# Patient Record
Sex: Female | Born: 1985 | Race: White | Hispanic: No | Marital: Married | Smoking: Never smoker
Health system: Southern US, Community
[De-identification: ages and names within clinical notes are randomized; demographics above are authoritative.]

## PROBLEM LIST (undated history)

## (undated) DIAGNOSIS — M940 Chondrocostal junction syndrome [Tietze]: Secondary | ICD-10-CM

## (undated) DIAGNOSIS — M1990 Unspecified osteoarthritis, unspecified site: Secondary | ICD-10-CM

## (undated) HISTORY — DX: Unspecified osteoarthritis, unspecified site: M19.90

## (undated) HISTORY — DX: Chondrocostal junction syndrome (tietze): M94.0

---

## 2009-04-19 HISTORY — PX: WISDOM TOOTH EXTRACTION: SHX21

## 2015-06-16 ENCOUNTER — Ambulatory Visit (INDEPENDENT_AMBULATORY_CARE_PROVIDER_SITE_OTHER): Payer: BLUE CROSS/BLUE SHIELD | Admitting: Nurse Practitioner

## 2015-06-16 ENCOUNTER — Encounter: Payer: Self-pay | Admitting: Nurse Practitioner

## 2015-06-16 VITALS — BP 98/56 | HR 77 | Temp 98.9°F | Resp 16 | Ht 62.0 in | Wt 118.4 lb

## 2015-06-16 DIAGNOSIS — L700 Acne vulgaris: Secondary | ICD-10-CM

## 2015-06-16 DIAGNOSIS — Z7689 Persons encountering health services in other specified circumstances: Secondary | ICD-10-CM

## 2015-06-16 DIAGNOSIS — Z7189 Other specified counseling: Secondary | ICD-10-CM | POA: Diagnosis not present

## 2015-06-16 DIAGNOSIS — M940 Chondrocostal junction syndrome [Tietze]: Secondary | ICD-10-CM

## 2015-06-16 DIAGNOSIS — L709 Acne, unspecified: Secondary | ICD-10-CM | POA: Insufficient documentation

## 2015-06-16 MED ORDER — TRETINOIN 0.025 % EX CREA: TOPICAL_CREAM | Freq: Every day | CUTANEOUS | Status: DC

## 2015-06-16 MED ORDER — NAPROXEN 500 MG PO TABS: 500.0000 mg | ORAL_TABLET | Freq: Two times a day (BID) | ORAL | Status: DC

## 2015-06-16 NOTE — Assessment & Plan Note (Signed)
New onset Forehead is biggest concern- papular and pt reports sometimes pustular.  Will try Retin-A at night  Cautioned about using sunscreen daily, esp. In the spring/summer FU in 1 month

## 2015-06-16 NOTE — Progress Notes (Signed)
Patient ID: Vickie Ruiz, female    DOB: 01-09-1986  Age: 30 y.o. MRN: [id number]  CC: Establish Care and Acne   HPI Vickie Ruiz presents for establishing care and CC of acne.   1) New Pt Info:   Immunizations- Tdap 2010   Pap- 07/02/14, back with [facility]   LMP 05/19/15   2) Chronic Problems-  Costochondritis- chronic, last flare in August, middle to left of chest   3) Acute Problems-  Acne- 3 weeks ago, not using anything, better after moisturizer. Clinique cleanswer, Aveeno moisturizer, Bare minerals (older and threw away). Not tried anything else in particular or seen dermatology.    History Vickie Ruiz has a past medical history of Arthritis.   She has past surgical history that includes wisdom (2011).   Her family history includes Cancer in her maternal grandfather and paternal grandfather; Hypertension in her mother; Stroke in her maternal grandmother.She reports that she has never smoked. She does not have any smokeless tobacco history on file. She reports that she drinks alcohol. She reports that she does not use illicit drugs.  No outpatient prescriptions prior to visit.   No facility-administered medications prior to visit.    ROS Review of Systems  Constitutional: Negative for fever, chills, diaphoresis and fatigue.  Respiratory: Negative for chest tightness, shortness of breath and wheezing.   Cardiovascular: Negative for chest pain, palpitations and leg swelling.  Gastrointestinal: Negative for nausea, vomiting and diarrhea.  Skin: Negative for rash.  Neurological: Negative for dizziness, weakness, numbness and headaches.  Psychiatric/Behavioral: The patient is not nervous/anxious.     Objective:  BP 98/56 mmHg  Pulse 77  Temp(Src) 98.9 F (37.2 C) (Oral)  Resp 16  Ht  (1.575 m)  Wt 118 lb 6.4 oz (53.706 kg)  BMI 21.65 kg/m2  SpO2 97%  LMP 05/19/2015  Physical Exam  Constitutional: She is oriented to person, place, and time. She appears  well-developed and well-nourished. No distress.  HENT:  Head: Normocephalic and atraumatic.    Right Ear: External ear normal.  Left Ear: External ear normal.  Papular rash of forehead (acne), not pustular today, scattered papules in the line where her glasses sit  Cardiovascular: Normal rate, regular rhythm and normal heart sounds.  Exam reveals no gallop and no friction rub.   No murmur heard. Pulmonary/Chest: Effort normal and breath sounds normal. No respiratory distress. She has no wheezes. She has no rales. She exhibits no tenderness.  Neurological: She is alert and oriented to person, place, and time. No cranial nerve deficit. She exhibits normal muscle tone. Coordination normal.  Skin: Skin is warm and dry. Rash noted. She is not diaphoretic.  Psychiatric: She has a normal mood and affect. Her behavior is normal. Judgment and thought content normal.   Assessment & Plan:   Vickie Ruiz was seen today for establish care and acne.  Diagnoses and all orders for this visit:  Encounter to establish care  Acne vulgaris  Costochondritis  Other orders -     tretinoin (RETIN-A) 0.025 % cream; Apply topically at bedtime. -     naproxen (NAPROSYN) 500 MG tablet; Take 1 tablet (500 mg total) by mouth 2 (two) times daily with a meal.   I have changed Vickie Ruiz's naproxen. I am also having her start on tretinoin. Additionally, I am having her maintain her levonorgestrel-ethinyl estradiol.  Meds ordered this encounter  Medications  . levonorgestrel-ethinyl estradiol (SEASONALE,INTROVALE,JOLESSA) 0.15-0.03 MG tablet    Sig: Take 1 tablet by mouth daily.  Refill:  2  . DISCONTD: naproxen (NAPROSYN) 500 MG tablet    Sig: Take 500 mg by mouth 2 (two) times daily with a meal.  . tretinoin (RETIN-A) 0.025 % cream    Sig: Apply topically at bedtime.    Dispense:  45 g    Refill:  0    Order Specific Question:  Supervising Provider    Answer:  Darrick Huntsman, TERESA L [[id number]]  . naproxen  (NAPROSYN) 500 MG tablet    Sig: Take 1 tablet (500 mg total) by mouth 2 (two) times daily with a meal.    Dispense:  60 tablet    Refill:  0    Order Specific Question:  Supervising Provider    Answer:  Sherlene Shams [[id number]]     Follow-up: Return in about 4 weeks (around 07/14/2015) for Acne follow up.

## 2015-06-16 NOTE — Patient Instructions (Signed)
Welcome to [facility]! Nice to meet you.  See you in 1 month for follow up on acne.

## 2015-06-16 NOTE — Assessment & Plan Note (Signed)
Discussed acute and chronic issues. Reviewed health maintenance measures, PFSHx, and immunizations. Obtain records from previous facility   [facility]  Dr. Loni Muse  [address], [place] [address]

## 2015-06-16 NOTE — Assessment & Plan Note (Signed)
New problem to me Flares every few years  Uses Naproxen and needs a refill Sent to pharmacy  FU prn worsening/failure to improve.

## 2015-07-10 LAB — HM PAP SMEAR: HM PAP: NEGATIVE

## 2015-07-17 ENCOUNTER — Ambulatory Visit (INDEPENDENT_AMBULATORY_CARE_PROVIDER_SITE_OTHER): Payer: BLUE CROSS/BLUE SHIELD | Admitting: Nurse Practitioner

## 2015-07-17 ENCOUNTER — Telehealth: Payer: Self-pay

## 2015-07-17 ENCOUNTER — Encounter: Payer: Self-pay | Admitting: Nurse Practitioner

## 2015-07-17 VITALS — BP 100/64 | HR 82 | Temp 97.8°F | Ht 62.0 in | Wt 114.1 lb

## 2015-07-17 DIAGNOSIS — Z23 Encounter for immunization: Secondary | ICD-10-CM | POA: Diagnosis not present

## 2015-07-17 DIAGNOSIS — Z0000 Encounter for general adult medical examination without abnormal findings: Secondary | ICD-10-CM

## 2015-07-17 LAB — CBC WITH DIFFERENTIAL/PLATELET
Basophils Absolute: 0 10*3/uL (ref 0.0–0.1)
Basophils Relative: 0.8 % (ref 0.0–3.0)
EOS ABS: 0.1 10*3/uL (ref 0.0–0.7)
Eosinophils Relative: 1.3 % (ref 0.0–5.0)
HEMATOCRIT: 41.9 % (ref 36.0–46.0)
Hemoglobin: 14.4 g/dL (ref 12.0–15.0)
LYMPHS PCT: 40.9 % (ref 12.0–46.0)
Lymphs Abs: 1.9 10*3/uL (ref 0.7–4.0)
MCHC: 34.2 g/dL (ref 30.0–36.0)
MCV: 91.6 fl (ref 78.0–100.0)
MONO ABS: 0.3 10*3/uL (ref 0.1–1.0)
Monocytes Relative: 7 % (ref 3.0–12.0)
Neutro Abs: 2.3 10*3/uL (ref 1.4–7.7)
Neutrophils Relative %: 50 % (ref 43.0–77.0)
PLATELETS: 200 10*3/uL (ref 150.0–400.0)
RBC: 4.58 Mil/uL (ref 3.87–5.11)
RDW: 13 % (ref 11.5–15.5)
WBC: 4.7 10*3/uL (ref 4.0–10.5)

## 2015-07-17 LAB — HEPATIC FUNCTION PANEL
ALK PHOS: 49 U/L (ref 39–117)
ALT: 26 U/L (ref 0–35)
AST: 23 U/L (ref 0–37)
Albumin: 4.1 g/dL (ref 3.5–5.2)
BILIRUBIN DIRECT: 0.1 mg/dL (ref 0.0–0.3)
TOTAL PROTEIN: 7.2 g/dL (ref 6.0–8.3)
Total Bilirubin: 0.5 mg/dL (ref 0.2–1.2)

## 2015-07-17 LAB — BASIC METABOLIC PANEL
BUN: 8 mg/dL (ref 6–23)
CHLORIDE: 103 meq/L (ref 96–112)
CO2: 25 mEq/L (ref 19–32)
CREATININE: 0.85 mg/dL (ref 0.40–1.20)
Calcium: 8.9 mg/dL (ref 8.4–10.5)
GFR: 84.34 mL/min (ref >60.00)
Glucose, Bld: 77 mg/dL (ref 70–99)
Potassium: 4.2 mEq/L (ref 3.5–5.1)
Sodium: 136 mEq/L (ref 135–145)

## 2015-07-17 LAB — LIPID PANEL
CHOL/HDL RATIO: 3
CHOLESTEROL: 134 mg/dL (ref 0–200)
HDL: 47.6 mg/dL (ref >39.00)
LDL Cholesterol: 76 mg/dL (ref 0–99)
NonHDL: 86.79
TRIGLYCERIDES: 55 mg/dL (ref 0.0–149.0)
VLDL: 11 mg/dL (ref 0.0–40.0)

## 2015-07-17 NOTE — Progress Notes (Signed)
Pre visit review using our clinic review tool, if applicable. No additional management support is needed unless otherwise documented below in the visit note. 

## 2015-07-17 NOTE — Progress Notes (Signed)
Patient ID: Vickie Ruiz, female    DOB: 01/31/1986  Age: 30 y.o. MRN: [id number]  CC: Annual Exam   HPI Vickie Ruiz presents for Vickie Ruiz Annual Physical Exam.  1) Annual Physical   Diet- eating healthier diet and slowly adding in small amounts of meat after many years of vegetarianism  Exercise- exercising multiple times a week  Immunizations- TDap given today  Pap- patient has OB/GYN  Eye Exam- up-to-date  Dental Exam- up-to-date  LMP- patient is on a regular cycle, unsure of last date  Labs- will obtain today  Depression- Neg.  Refills: Declines need for   History Vickie Ruiz has a past medical history of Arthritis and Costochondritis.   Vickie Ruiz has past surgical history that includes wisdom (2011).   Vickie Ruiz family history includes Cancer in Vickie Ruiz maternal grandfather and paternal grandfather; Hypertension in Vickie Ruiz mother; Stroke in Vickie Ruiz maternal grandmother.Vickie Ruiz reports that Vickie Ruiz has never smoked. Vickie Ruiz does not have any smokeless tobacco history on file. Vickie Ruiz reports that Vickie Ruiz drinks alcohol. Vickie Ruiz reports that Vickie Ruiz does not use illicit drugs.  Outpatient Prescriptions Prior to Visit  Medication Sig Dispense Refill  . levonorgestrel-ethinyl estradiol (SEASONALE,INTROVALE,JOLESSA) 0.15-0.03 MG tablet Take 1 tablet by mouth daily.  2  . tretinoin (RETIN-A) 0.025 % cream Apply topically at bedtime. 45 g 0  . naproxen (NAPROSYN) 500 MG tablet Take 1 tablet (500 mg total) by mouth 2 (two) times daily with a meal. (Patient not taking: Reported on 07/17/2015) 60 tablet 0   No facility-administered medications prior to visit.    ROS Review of Systems  Constitutional: Negative for fever, chills, diaphoresis and fatigue.  Respiratory: Negative for chest tightness, shortness of breath and wheezing.   Cardiovascular: Negative for chest pain, palpitations and leg swelling.  Gastrointestinal: Negative for nausea, vomiting and diarrhea.  Skin: Negative for rash.  Neurological: Negative for dizziness,  weakness, numbness and headaches.  Psychiatric/Behavioral: The patient is not nervous/anxious.     Objective:  BP 100/64 mmHg  Pulse 82  Temp(Src) 97.8 F (36.6 C) (Oral)  Ht  (1.575 m)  Wt 114 lb 2 oz (51.767 kg)  BMI 20.87 kg/m2  SpO2 94%  Physical Exam  Constitutional: Vickie Ruiz is oriented to person, place, and time. Vickie Ruiz appears well-developed and well-nourished. No distress.  HENT:  Head: Normocephalic and atraumatic.  Right Ear: External ear normal.  Left Ear: External ear normal.  Nose: Nose normal.  Mouth/Throat: Oropharynx is clear and moist. No oropharyngeal exudate.  TMs and canals clear bilaterally  Eyes: Conjunctivae and EOM are normal. Pupils are equal, round, and reactive to light. Right eye exhibits no discharge. Left eye exhibits no discharge. No scleral icterus.  Neck: Normal range of motion. Neck supple. No thyromegaly present.  Cardiovascular: Normal rate, regular rhythm, normal heart sounds and intact distal pulses.  Exam reveals no gallop and no friction rub.   No murmur heard. Pulmonary/Chest: Effort normal and breath sounds normal. No respiratory distress. Vickie Ruiz has no wheezes. Vickie Ruiz has no rales. Vickie Ruiz exhibits no tenderness.  Breast exam deferred to OB/GYN  Abdominal: Soft. Bowel sounds are normal. Vickie Ruiz exhibits no distension and no mass. There is no tenderness. There is no rebound and no guarding.  Thin body habitus  Genitourinary:  Deferred to OB/GYN  Musculoskeletal: Normal range of motion. Vickie Ruiz exhibits no edema or tenderness.  Lymphadenopathy:    Vickie Ruiz has no cervical adenopathy.  Neurological: Vickie Ruiz is alert and oriented to person, place, and time. Vickie Ruiz has normal reflexes. No cranial nerve deficit.  Vickie Ruiz exhibits normal muscle tone. Coordination normal.  Skin: Skin is warm and dry. No rash noted. Vickie Ruiz is not diaphoretic. No erythema. No pallor.  Psychiatric: Vickie Ruiz has a normal mood and affect. Vickie Ruiz behavior is normal. Judgment and thought content normal.    Assessment & Plan:   Vickie Ruiz was seen today for annual exam.  Diagnoses and all orders for this visit:  Routine general medical examination at a health care facility -     CBC w/Diff -     Lipid panel -     Hepatic function panel -     Basic Metabolic Panel (BMET)  Need for prophylactic vaccination with combined diphtheria-tetanus-pertussis (DTP) vaccine -     Tdap vaccine greater than or equal to 7yo IM   I am having Vickie Ruiz maintain Vickie Ruiz levonorgestrel-ethinyl estradiol, tretinoin, and naproxen.  No orders of the defined types were placed in this encounter.     Follow-up: Return if symptoms worsen or fail to improve.

## 2015-07-17 NOTE — Telephone Encounter (Signed)
Checked results and they were final. Faxed form to [phone] per pt request. Mailed form with all requested information.   Forwarding phone note to PCP to send her copy to scan when she gets back to the office.

## 2015-07-17 NOTE — Patient Instructions (Signed)
Health Maintenance, Female Adopting a healthy lifestyle and getting preventive care can go a long way to promote health and wellness. Talk with your health care provider about what schedule of regular examinations is right for you. This is a good chance for you to check in with your provider about disease prevention and staying healthy. In between checkups, there are plenty of things you can do on your own. Experts have done a lot of research about which lifestyle changes and preventive measures are most likely to keep you healthy. Ask your health care provider for more information. WEIGHT AND DIET  Eat a healthy diet  Be sure to include plenty of vegetables, fruits, low-fat dairy products, and lean protein.  Do not eat a lot of foods high in solid fats, added sugars, or salt.  Get regular exercise. This is one of the most important things you can do for your health.  Most adults should exercise for at least 150 minutes each week. The exercise should increase your heart rate and make you sweat (moderate-intensity exercise).  Most adults should also do strengthening exercises at least twice a week. This is in addition to the moderate-intensity exercise.  Maintain a healthy weight  Body mass index (BMI) is a measurement that can be used to identify possible weight problems. It estimates body fat based on height and weight. Your health care provider can help determine your BMI and help you achieve or maintain a healthy weight.  For females 20 years of age and older:   A BMI below 18.5 is considered underweight.  A BMI of 18.5 to 24.9 is normal.  A BMI of 25 to 29.9 is considered overweight.  A BMI of 30 and above is considered obese.  Watch levels of cholesterol and blood lipids  You should start having your blood tested for lipids and cholesterol at 29 years of age, then have this test every 5 years.  You may need to have your cholesterol levels checked more often if:  Your lipid  or cholesterol levels are high.  You are older than 29 years of age.  You are at high risk for heart disease.  CANCER SCREENING   Lung Cancer  Lung cancer screening is recommended for adults 55-80 years old who are at high risk for lung cancer because of a history of smoking.  A yearly low-dose CT scan of the lungs is recommended for people who:  Currently smoke.  Have quit within the past 15 years.  Have at least a 30-pack-year history of smoking. A pack year is smoking an average of one pack of cigarettes a day for 1 year.  Yearly screening should continue until it has been 15 years since you quit.  Yearly screening should stop if you develop a health problem that would prevent you from having lung cancer treatment.  Breast Cancer  Practice breast self-awareness. This means understanding how your breasts normally appear and feel.  It also means doing regular breast self-exams. Let your health care provider know about any changes, no matter how small.  If you are in your 20s or 30s, you should have a clinical breast exam (CBE) by a health care provider every 1-3 years as part of a regular health exam.  If you are 40 or older, have a CBE every year. Also consider having a breast X-ray (mammogram) every year.  If you have a family history of breast cancer, talk to your health care provider about genetic screening.  If you   are at high risk for breast cancer, talk to your health care provider about having an MRI and a mammogram every year.  Breast cancer gene (BRCA) assessment is recommended for women who have family members with BRCA-related cancers. BRCA-related cancers include:  Breast.  Ovarian.  Tubal.  Peritoneal cancers.  Results of the assessment will determine the need for genetic counseling and BRCA1 and BRCA2 testing. Cervical Cancer Your health care provider may recommend that you be screened regularly for cancer of the pelvic organs (ovaries, uterus, and  vagina). This screening involves a pelvic examination, including checking for microscopic changes to the surface of your cervix (Pap test). You may be encouraged to have this screening done every 3 years, beginning at age 21.  For women ages 30-65, health care providers may recommend pelvic exams and Pap testing every 3 years, or they may recommend the Pap and pelvic exam, combined with testing for human papilloma virus (HPV), every 5 years. Some types of HPV increase your risk of cervical cancer. Testing for HPV may also be done on women of any age with unclear Pap test results.  Other health care providers may not recommend any screening for nonpregnant women who are considered low risk for pelvic cancer and who do not have symptoms. Ask your health care provider if a screening pelvic exam is right for you.  If you have had past treatment for cervical cancer or a condition that could lead to cancer, you need Pap tests and screening for cancer for at least 20 years after your treatment. If Pap tests have been discontinued, your risk factors (such as having a new sexual partner) need to be reassessed to determine if screening should resume. Some women have medical problems that increase the chance of getting cervical cancer. In these cases, your health care provider may recommend more frequent screening and Pap tests. Colorectal Cancer  This type of cancer can be detected and often prevented.  Routine colorectal cancer screening usually begins at 29 years of age and continues through 29 years of age.  Your health care provider may recommend screening at an earlier age if you have risk factors for colon cancer.  Your health care provider may also recommend using home test kits to check for hidden blood in the stool.  A small camera at the end of a tube can be used to examine your colon directly (sigmoidoscopy or colonoscopy). This is done to check for the earliest forms of colorectal  cancer.  Routine screening usually begins at age 50.  Direct examination of the colon should be repeated every 5-10 years through 29 years of age. However, you may need to be screened more often if early forms of precancerous polyps or small growths are found. Skin Cancer  Check your skin from head to toe regularly.  Tell your health care provider about any new moles or changes in moles, especially if there is a change in a mole's shape or color.  Also tell your health care provider if you have a mole that is larger than the size of a pencil eraser.  Always use sunscreen. Apply sunscreen liberally and repeatedly throughout the day.  Protect yourself by wearing long sleeves, pants, a wide-brimmed hat, and sunglasses whenever you are outside. HEART DISEASE, DIABETES, AND HIGH BLOOD PRESSURE   High blood pressure causes heart disease and increases the risk of stroke. High blood pressure is more likely to develop in:  People who have blood pressure in the high end   of the normal range (130-139/85-89 mm Hg).  People who are overweight or obese.  People who are African American.  If you are 38-23 years of age, have your blood pressure checked every 3-5 years. If you are 61 years of age or older, have your blood pressure checked every year. You should have your blood pressure measured twice--once when you are at a hospital or clinic, and once when you are not at a hospital or clinic. Record the average of the two measurements. To check your blood pressure when you are not at a hospital or clinic, you can use:  An automated blood pressure machine at a pharmacy.  A home blood pressure monitor.  If you are between 45 years and 39 years old, ask your health care provider if you should take aspirin to prevent strokes.  Have regular diabetes screenings. This involves taking a blood sample to check your fasting blood sugar level.  If you are at a normal weight and have a low risk for diabetes,  have this test once every three years after 29 years of age.  If you are overweight and have a high risk for diabetes, consider being tested at a younger age or more often. PREVENTING INFECTION  Hepatitis B  If you have a higher risk for hepatitis B, you should be screened for this virus. You are considered at high risk for hepatitis B if:  You were born in a country where hepatitis B is common. Ask your health care provider which countries are considered high risk.  Your parents were born in a high-risk country, and you have not been immunized against hepatitis B (hepatitis B vaccine).  You have HIV or AIDS.  You use needles to inject street drugs.  You live with someone who has hepatitis B.  You have had sex with someone who has hepatitis B.  You get hemodialysis treatment.  You take certain medicines for conditions, including cancer, organ transplantation, and autoimmune conditions. Hepatitis C  Blood testing is recommended for:  Everyone born from [birth year] through 1964.  Anyone with known risk factors for hepatitis C. Sexually transmitted infections (STIs)  You should be screened for sexually transmitted infections (STIs) including gonorrhea and chlamydia if:  You are sexually active and are younger than 29 years of age.  You are older than 29 years of age and your health care provider tells you that you are at risk for this type of infection.  Your sexual activity has changed since you were last screened and you are at an increased risk for chlamydia or gonorrhea. Ask your health care provider if you are at risk.  If you do not have HIV, but are at risk, it may be recommended that you take a prescription medicine daily to prevent HIV infection. This is called pre-exposure prophylaxis (PrEP). You are considered at risk if:  You are sexually active and do not regularly use condoms or know the HIV status of your partner(s).  You take drugs by injection.  You are sexually  active with a partner who has HIV. Talk with your health care provider about whether you are at high risk of being infected with HIV. If you choose to begin PrEP, you should first be tested for HIV. You should then be tested every 3 months for as long as you are taking PrEP.  PREGNANCY   If you are premenopausal and you may become pregnant, ask your health care provider about preconception counseling.  If you may  become pregnant, take 400 to 800 micrograms (mcg) of folic acid every day.  If you want to prevent pregnancy, talk to your health care provider about birth control (contraception). OSTEOPOROSIS AND MENOPAUSE   Osteoporosis is a disease in which the bones lose minerals and strength with aging. This can result in serious bone fractures. Your risk for osteoporosis can be identified using a bone density scan.  If you are 61 years of age or older, or if you are at risk for osteoporosis and fractures, ask your health care provider if you should be screened.  Ask your health care provider whether you should take a calcium or vitamin D supplement to lower your risk for osteoporosis.  Menopause may have certain physical symptoms and risks.  Hormone replacement therapy may reduce some of these symptoms and risks. Talk to your health care provider about whether hormone replacement therapy is right for you.  HOME CARE INSTRUCTIONS   Schedule regular health, dental, and eye exams.  Stay current with your immunizations.   Do not use any tobacco products including cigarettes, chewing tobacco, or electronic cigarettes.  If you are pregnant, do not drink alcohol.  If you are breastfeeding, limit how much and how often you drink alcohol.  Limit alcohol intake to no more than 1 drink per day for nonpregnant women. One drink equals 12 ounces of beer, 5 ounces of wine, or 1 ounces of hard liquor.  Do not use street drugs.  Do not share needles.  Ask your health care provider for help if  you need support or information about quitting drugs.  Tell your health care provider if you often feel depressed.  Tell your health care provider if you have ever been abused or do not feel safe at home.   This information is not intended to replace advice given to you by your health care provider. Make sure you discuss any questions you have with your health care provider.   Document Released: 10/19/2010 Document Revised: 04/26/2014 Document Reviewed: 03/07/2013 Elsevier Interactive Patient Education [employer].

## 2015-07-17 NOTE — Telephone Encounter (Signed)
Pt came in for an OV today. She also brought with her a biometric form from her employer.  I have all of the pt data and MRN on it with vital that were collected at the time of her appt.   We have a copy of the form to be sent to scan once it is completed. Pt needs her copy mailed today. Form are currently in Carrie's box.

## 2015-07-17 NOTE — Assessment & Plan Note (Addendum)
Discussed acute and chronic issues. Reviewed health maintenance measures, PFSHx, and immunizations. Obtain routine labs TSH, Lipid panel, CBC w/ diff, and CMET.   Tdap Updated today Form will be filled out and faxed after results

## 2015-07-18 ENCOUNTER — Encounter: Payer: Self-pay | Admitting: Nurse Practitioner

## 2015-07-21 NOTE — Telephone Encounter (Signed)
Faxed

## 2015-11-17 ENCOUNTER — Ambulatory Visit
Admission: RE | Admit: 2015-11-17 | Discharge: 2015-11-17 | Disposition: A | Discharge status: Home / Self Care | Payer: BLUE CROSS/BLUE SHIELD | Source: Ambulatory Visit | Attending: Obstetrics and Gynecology | Admitting: Obstetrics and Gynecology

## 2015-11-17 DIAGNOSIS — Z3169 Encounter for other general counseling and advice on procreation: Secondary | ICD-10-CM

## 2015-11-17 NOTE — Progress Notes (Addendum)
Referring Physician:  [facility] 45 minute consultation  Vickie Ruiz was referred to [facility]  for genetic counseling to discuss any possible risks to a future pregnancy related to advancing paternal age and paternal medication exposures.   This letter is a summary of our discussion.    Prior to reviewing the specific exposures, we reviewed the general principles of birth defects.  Every pregnancy carries the risk for major or minor malformations of approximately 2-3%.  To show that an exposure or health condition is teratogenic, the rate of abnormalities for exposed pregnancies must be greater than that expected due to the background risk.  It is important for patients to review all medications with the doctors who treat them prior to considering pregnancy to determine the safest combination of medications for management of maternal or paternal health while minimizing risks to the fetus.  Vickie Ruiz and her husband have already spoken with his rheumatologist and made changes based upon his recommendations.    We then obtained a detailed family history.  The patient is 30 years old and in good health. She is not currently pregnant and neither of them have pregnancies from prior relationships.  Her husband, Vickie Ruiz, is 81 years old. He has been diagnosed with rheumatoid arthritis (RA), osteoporosis and hypertension.  His mother also had all three of these conditions before she passed away at 29 years old.  Vickie Ruiz reported both of her parents as having hypertension.  Both she and her husband reported one couple with possible learning disabilities, but the specific nature of their delays or cause for those delays is not known.    We reviewed that autoimmune diseases including RA are thought to be multifactorial inheritance.  This means that there are known genetic factors which may predispose an individual to developing the condition, but likely environmental factors  are necessary to trigger development of symptoms.  The base line chance for RA in the population is approximately 1%.  The recurrence chance for RA in first degree relatives of an affected family member is estimated to be increased, likely 3-5%.  Hypertension is also thought to be multifactorial, with diet, exercise and genetic factors.  Lastly, we reviewed that developmental differences including autism spectrum disorders and mild learning disabilities may have many causes, some of which are inherited and others that are not.  Without a specific diagnosis in these family members, it is difficult to offer specific testing or recurrence risk assessments for other relatives.  We did review the option of carrier testing for Fragile X syndrome, though this is unlikely to be a concern for a pregnancy for this couple due to fact that both cousins are related to each other through multiple males.  The remainder of the family history is unremarkable for birth defects, developmental differences, recurrent pregnancy loss or known genetic conditions.  Vickie Ruiz reported that he is currently taking the following medications as prescribed by his physicians:  Amitriptyline, a tricyclic antidepressant Cartia XT, a calcium channel blocker for hypertension Folic Acid Alendronate for osteoporosis Methotrexate, for RA, stopped at the recommendation of his rheumatologist  Acyclovir as needed for cold sores Meloxicam, an NSAID Prednisone if needed for RA while stopped from the Methotrexate  And the following supplements: Aspirin Centrum Silver Fish Oil Potassium Prostate SR supplement Red yeast Rice Vitamin C Vitamin D Turmeric/Curcumin Ginger Root  For most medications, there is more data available on reproductive effects for women than for men.  Controlled studies of the  use of medications as it relates to human reproduction are also difficult, particularly for supplements which may not be controlled as carefully  as prescribed medications.  In general, paternal exposures prior to conception are less concerning than maternal exposures.  There is data to suggest that sperm which are abnormal for any reason will be less fit to swim and fertilize the egg than "healthy" or normally functioning sperm.  Thus, this provides some natural selection for normal sperm.  It is also known that it takes approximately 3 months for the production of sperm.  Therefore, exposures in the three month prior to conception would be most concerning for paternal exposures.  It may be for this reason that Vickie Ruiz' rheumatologist recommended avoiding the use of Methotrexate for 12 weeks prior to attempting to conceive.    Methotrexate is known to increase the rate of birth defects when taken by pregnant women.  There is less data on its use in fathers.  One study on 36 livebirths in which the father filled a prescription for methotrexate in the 90 days prior to conception showed on increased incidence of birth defects or stillbirth.  There is some evidence for sexual dysfunction including impotence and low sperm count for men while taking this medication.  Amitriptyline also has limited data.  Two small studies were identified which commented on a decreased libido and sexual dysfunction in males, but did not comment on anomalies or pregnancy outcomes. Nancie Neas XT has also been associated with possible decreased fertility in males, but no mentioned of pregnancy outcomes was listed.  Limited data on both Acyclovir and prednisone use in males showed no adverse effects on the production of sperm.  No human reproductive data was found on either alendronate or meloxicam in males.  Vickie Ruiz is currently 30 years old.  Advanced paternal age is known to be associated with an increased chance for several fetal health conditions.  Therefore, we also reviewed the issues surrounding advanced paternal age.  There is known to be an increase in single gene condition in  the children of men who are over age 62-50, though the specific age varies in different studies.  This chance is estimated to be no greater than 2%. These mutations may result in birth defects or genetic syndromes which may show differences on ultrasound in the second trimester.  Therefore, we recommend a detailed anatomy ultrasound at approximately [redacted] weeks gestation. A normal ultrasound cannot rule out these disorders. Advanced paternal age has also been associated with other conditions including autism spectrum disorders, schizophrenia and childhood acute lymphoblastic leukemia.  It is important to be aware of these associations, though no clinical testing is available for these conditions at this time.  Some studies have also suggested an increase in chromosome conditions, though this has not been confirmed in other studies and may be confounded by advancing maternal age in those studies.    Cystic Fibrosis and Spinal Muscular Atrophy (SMA) screening were also discussed with the patient. Both conditions are recessive, which means that both parents must be carriers in order to have a child with the disease.  Cystic fibrosis (CF) is one of the most common genetic conditions in persons of Caucasian ancestry.  This condition occurs in approximately 1 in 2,500 Caucasian persons and results in thickened secretions in the lungs, digestive, and reproductive systems.  For a baby to be at risk for having CF, both of the parents must be carriers for this condition.  Approximately 1 in 71 Caucasian persons  is a carrier for CF.  Current carrier testing looks for the most common mutations in the gene for CF and can detect approximately 90% of carriers in the Caucasian population.  This means that the carrier screening can greatly reduce, but cannot eliminate, the chance for an individual to have a child with CF.  If an individual is found to be a carrier for CF, then carrier testing would be available for the partner. As  part of [place]'s newborn screening profile, all babies born in the state of [place] will have a two-tier screening process.  Specimens are first tested to determine the concentration of immunoreactive trypsinogen (IRT).  The top 5% of specimens with the highest IRT values then undergo DNA testing using a panel of over 40 common CF mutations. SMA is a neurodegenerative disorder that leads to atrophy of skeletal muscle and overall weakness.  This condition is also more prevalent in the Caucasian population, with 1 in 40-1 in 60 persons being a carrier and 1 in 6,000-1 in 10,000 children being affected.  There are multiple forms of the disease, with some causing death in infancy to other forms with survival into adulthood.  The genetics of SMA is complex, but carrier screening can detect up to 95% of carriers in the Caucasian population.  Similar to CF, a negative result can greatly reduce, but cannot eliminate, the chance to have a child with SMA.  In summary, by far the most likely result of any pregnancy for Mr. and Mrs. Barcia regardless of paternal age, health concerns and exposures, would be a health pregnancy.  We would offer testing as outlined below for many of the conditions we discussed, however, it is important to remember that there is currently no testing which can rule out all developmental differences or birth defects prenatally.    We would recommend considering the following testing in any future pregnancy:  . Cell free fetal DNA testing for aneuploidy at 12-[redacted] weeks gestation. . Detailed anatomy ultrasound at [redacted] weeks gestation. . Consideration of carrier screening for CF, SMA and Fragile X syndrome if desired. . If any of the above testing were to reveal an increased risk for a genetic condition or birth defect, genetic counseling could be provided to offered additional prenatal diagnostic testing.   The patient was encouraged to contact us with any questions or concerns.   We can be reached at [phone].  Cherly Anderson, MS, CGC

## 2015-12-01 ENCOUNTER — Emergency Department
Admission: EM | Admit: 2015-12-01 | Discharge: 2015-12-02 | Disposition: A | Discharge status: Home / Self Care | Payer: BLUE CROSS/BLUE SHIELD | Attending: Emergency Medicine | Admitting: Emergency Medicine

## 2015-12-01 ENCOUNTER — Encounter: Payer: Self-pay | Admitting: Emergency Medicine

## 2015-12-01 ENCOUNTER — Emergency Department: Payer: BLUE CROSS/BLUE SHIELD

## 2015-12-01 DIAGNOSIS — R109 Unspecified abdominal pain: Secondary | ICD-10-CM

## 2015-12-01 DIAGNOSIS — Z79899 Other long term (current) drug therapy: Secondary | ICD-10-CM | POA: Insufficient documentation

## 2015-12-01 DIAGNOSIS — R1032 Left lower quadrant pain: Secondary | ICD-10-CM | POA: Diagnosis not present

## 2015-12-01 LAB — COMPREHENSIVE METABOLIC PANEL
ALT: 29 U/L (ref 14–54)
AST: 29 U/L (ref 15–41)
Albumin: 4.4 g/dL (ref 3.5–5.0)
Alkaline Phosphatase: 49 U/L (ref 38–126)
Anion gap: 7 (ref 5–15)
BUN: 9 mg/dL (ref 6–20)
CHLORIDE: 99 mmol/L — AB (ref 101–111)
CO2: 27 mmol/L (ref 22–32)
CREATININE: 0.77 mg/dL (ref 0.44–1.00)
Calcium: 9.6 mg/dL (ref 8.9–10.3)
GFR calc non Af Amer: >60 mL/min (ref >60)
Glucose, Bld: 83 mg/dL (ref 65–99)
Potassium: 4 mmol/L (ref 3.5–5.1)
SODIUM: 133 mmol/L — AB (ref 135–145)
Total Bilirubin: 0.6 mg/dL (ref 0.3–1.2)
Total Protein: 7.3 g/dL (ref 6.5–8.1)

## 2015-12-01 LAB — CBC
HCT: 44.3 % (ref 35.0–47.0)
Hemoglobin: 15 g/dL (ref 12.0–16.0)
MCH: 31.5 pg (ref 26.0–34.0)
MCHC: 34 g/dL (ref 32.0–36.0)
MCV: 92.6 fL (ref 80.0–100.0)
PLATELETS: 196 10*3/uL (ref 150–440)
RBC: 4.78 MIL/uL (ref 3.80–5.20)
RDW: 12.3 % (ref 11.5–14.5)
WBC: 10.1 10*3/uL (ref 3.6–11.0)

## 2015-12-01 LAB — POCT PREGNANCY, URINE: PREG TEST UR: NEGATIVE

## 2015-12-01 LAB — URINALYSIS COMPLETE WITH MICROSCOPIC (ARMC ONLY)
Bilirubin Urine: NEGATIVE
Glucose, UA: NEGATIVE mg/dL
Hgb urine dipstick: NEGATIVE
Nitrite: NEGATIVE
PROTEIN: NEGATIVE mg/dL
Specific Gravity, Urine: 1.002 — ABNORMAL LOW (ref 1.005–1.030)
pH: 6 (ref 5.0–8.0)

## 2015-12-01 LAB — LIPASE, BLOOD: LIPASE: 23 U/L (ref 11–51)

## 2015-12-01 NOTE — ED Notes (Signed)
MD at bedside. 

## 2015-12-01 NOTE — ED Triage Notes (Signed)
Pt ambulatory to triage with steady gait, pt c/o lower abdominal pain since 11:00 this morning. Pt reports was seen today at Urgent Care and had xray and blood work done, pt reports was dx with "air pocket in my intestine" and elevated white blood count. Pt given prescription for Cipro. Pt reports having increased pain reports took antacids and naproxen without relief. Pt denies urinary problems, reports LBM yesterday, states took Miralax without relief. Pt alert and oriented x 4, no increased work in breathing.

## 2015-12-01 NOTE — ED Provider Notes (Signed)
[facility] Emergency Department Provider Note   ____________________________________________   First MD Initiated Contact with Patient 12/01/15 2309     (approximate)  I have reviewed the triage vital signs and the nursing notes.   HISTORY  Chief Complaint Abdominal Pain    HPI Vickie Ruiz is a 30 y.o. female who comes into the hospital today with severe abdominal pain. She reports this has been going on for about 8 hours. She reportsthat she took MiraLAX and naproxen as well as Pepto-Bismol but nothing has helped. The patient reports she had a bowel movement this morning and it seemed normal. The pain started around 11:30 this afternoon. Around 2 PM she went to urgent care and they did an x-ray she reports looking for cyst but she was told that she had an air pocket and stool in her belly and that she may have a slow down of her bowel movements. The patient also had some white blood cells in her urine so they gave her Cipro but she has not yet taken it. The patient denies any pain with urination and has no known cyst on her ovaries. The patient's last menstrual period was 3 weeks ago but she takes 3 month birth control. The patient reports her pain currently as a 3-4 out of 10 in intensity but spikes every few minutes and 9. The patient has had some nausea with no vomiting and no fevers. She is here for evaluation today.   Past Medical History:  Diagnosis Date  . Arthritis   . Costochondritis     Patient Active Problem List   Diagnosis Date Noted  . Encounter for preconception consultation 11/17/2015  . Routine general medical examination at a health care facility 07/17/2015  . Encounter to establish care 06/16/2015  . Acne 06/16/2015  . Costochondritis 06/16/2015    Past Surgical History:  Procedure Laterality Date  . WISDOM TOOTH EXTRACTION  2011    Prior to Admission medications   Medication Sig Start Date End Date Taking? Authorizing  Provider  levonorgestrel-ethinyl estradiol (SEASONALE,INTROVALE,JOLESSA) 0.15-0.03 MG tablet Take 1 tablet by mouth daily. 05/09/15  Yes Historical Provider, MD  naproxen (NAPROSYN) 500 MG tablet Take 1 tablet (500 mg total) by mouth 2 (two) times daily with a meal. 06/16/15  Yes Carollee Leitzarrie M Doss, NP  Prenatal Vit-Fe Fumarate-FA (PRENATAL MULTIVITAMIN) TABS tablet Take 1 tablet by mouth daily at 12 noon.   Yes Historical Provider, MD  ciprofloxacin (CIPRO) 500 MG tablet Take 500 mg by mouth 2 (two) times daily.    Historical Provider, MD  tretinoin (RETIN-A) 0.025 % cream Apply topically at bedtime. Patient not taking: Reported on 12/02/2015 06/16/15   Carollee Leitzarrie M Doss, NP    Allergies Review of patient's allergies indicates no known allergies.  Family History  Problem Relation Age of Onset  . Hypertension Mother   . Stroke Maternal Grandmother   . Cancer Maternal Grandfather     Lung  . Cancer Paternal Grandfather     lung    Social History Social History  Substance Use Topics  . Smoking status: Never Smoker  . Smokeless tobacco: Never Used  . Alcohol use 0.0 oz/week     Comment: occasionally    Review of Systems Constitutional: No fever/chills Eyes: No visual changes. ENT: No sore throat. Cardiovascular: Denies chest pain. Respiratory: Denies shortness of breath. Gastrointestinal:  abdominal pain. nausea, no vomiting.  No diarrhea.  No constipation. Genitourinary: Negative for dysuria. Musculoskeletal: Negative for back pain.  Skin: Negative for rash. Neurological: Negative for headaches, focal weakness or numbness.  10-point ROS otherwise negative.  ____________________________________________   PHYSICAL EXAM:  VITAL SIGNS: ED Triage Vitals [12/01/15 2052]  Enc Vitals Group     BP 127/86     Pulse Rate 61     Resp 18     Temp 98.2 F (36.8 C)     Temp Source Oral     SpO2 100 %     Weight 114 lb (51.7 kg)     Height 5\' 2"  (1.575 m)     Head Circumference       Peak Flow      Pain Score 3     Pain Loc      Pain Edu?      Excl. in GC?     Constitutional: Alert and oriented. Well appearing and in Mild distress. Eyes: Conjunctivae are normal. PERRL. EOMI. Head: Atraumatic. Nose: No congestion/rhinnorhea. Mouth/Throat: Mucous membranes are moist.  Oropharynx non-erythematous. Cardiovascular: Normal rate, regular rhythm. Grossly normal heart sounds.  Good peripheral circulation. Respiratory: Normal respiratory effort.  No retractions. Lungs CTAB. Gastrointestinal: Soft with left lower quadrant tenderness to palpation. No distention. Active bowel sounds Genitourinary: Normal external genitalia, mild tenderness to palpation of left adnexa mild white discharge or bleeding. Musculoskeletal: No lower extremity tenderness nor edema.   Neurologic:  Normal speech and language.  Skin:  Skin is warm, dry and intact.  Psychiatric: Mood and affect are normal.   ____________________________________________   LABS (all labs ordered are listed, but only abnormal results are displayed)  Labs Reviewed  WET PREP, GENITAL - Abnormal; Notable for the following:       Result Value   WBC, Wet Prep HPF POC MANY (*)    All other components within normal limits  COMPREHENSIVE METABOLIC PANEL - Abnormal; Notable for the following:    Sodium 133 (*)    Chloride 99 (*)    All other components within normal limits  URINALYSIS COMPLETEWITH MICROSCOPIC ([facility] ONLY) - Abnormal; Notable for the following:    Color, Urine COLORLESS (*)    APPearance CLEAR (*)    Ketones, ur TRACE (*)    Specific Gravity, Urine 1.002 (*)    Leukocytes, UA 1+ (*)    Bacteria, UA RARE (*)    Squamous Epithelial / LPF 0-5 (*)    All other components within normal limits  CHLAMYDIA/NGC RT PCR ([facility] ONLY)  LIPASE, BLOOD  CBC  POC URINE PREG, ED  POCT PREGNANCY, URINE    ____________________________________________  EKG  none ____________________________________________  RADIOLOGY  US pelvis CT head ____________________________________________   PROCEDURES  Procedure(s) performed: None  Procedures  Critical Care performed: No  ____________________________________________   INITIAL IMPRESSION / ASSESSMENT AND PLAN / ED COURSE  Pertinent labs & imaging results that were available during my care of the patient were reviewed by me and considered in my medical decision making (see chart for details).  This is a 30 year old female who comes to the hospital today with severe abdominal pain. This pain has been going on for hours. The patient was seen earlier care and her blood work is unremarkable but she did not have any imaging studies. I will do a pelvic exam and send the patient for an ultrasound. I will then reassess the patient after I received the results of her ultrasound.  Clinical Course  Value Comment By Time  US Pelvis Complete Unremarkable pelvic ultrasound.  No evidence for ovarian torsion Revonda StandardAllison  Catalina Gravel, MD 08/15 [id number]  CT Abdomen Pelvis W Contrast Unremarkable contrast-enhanced CT of the abdomen and pelvis Rebecka Apley, MD 08/15 [phone]   The patient's pain at this time is improved. The patient's ultrasound and CT scan are negative. I do not have a good reason or cause for the patient's pain but her workup at this time is unremarkable. I discussed this with the patient and informed her to continue drinking fluids and to follow-up with her doctor. The patient understands and agrees with the plan. She'll be discharged home. Rebecka Apley, MD 08/15 0413     ____________________________________________   FINAL CLINICAL IMPRESSION(S) / ED DIAGNOSES  Final diagnoses:  Abdominal pain  Left lower quadrant pain      NEW MEDICATIONS STARTED DURING THIS VISIT:  New Prescriptions   No medications on file     Note:   This document was prepared using Dragon voice recognition software and may include unintentional dictation errors.    Rebecka Apley, MD 12/02/15 [phone]

## 2015-12-02 ENCOUNTER — Encounter: Payer: Self-pay | Admitting: Radiology

## 2015-12-02 ENCOUNTER — Emergency Department: Payer: BLUE CROSS/BLUE SHIELD

## 2015-12-02 LAB — WET PREP, GENITAL
Clue Cells Wet Prep HPF POC: NONE SEEN
Sperm: NONE SEEN
Trich, Wet Prep: NONE SEEN
YEAST WET PREP: NONE SEEN

## 2015-12-02 LAB — CHLAMYDIA/NGC RT PCR (ARMC ONLY)
Chlamydia Tr: NOT DETECTED
N GONORRHOEAE: NOT DETECTED

## 2015-12-02 MED ORDER — DIATRIZOATE MEGLUMINE & SODIUM 66-10 % PO SOLN
15.0000 mL | ORAL | Status: AC
  Administered 2015-12-02: 15 mL via ORAL

## 2015-12-02 MED ORDER — IOPAMIDOL (ISOVUE-300) INJECTION 61%
100.0000 mL | Freq: Once | INTRAVENOUS | Status: AC | PRN
  Administered 2015-12-02: 100 mL via INTRAVENOUS

## 2016-04-19 NOTE — L&D Delivery Note (Signed)
Delivery Note Primary OB: [facility] Delivery Physician: Annamarie MajorPaul Gillis Boardley, MD Gestational Age: Full term Antepartum complications: none Intrapartum complications: None  A viable Female was delivered via vertex perentation.  Apgars:9 ,9  Weight:  pending .   Placenta status: spontaneous and Intact.  Cord: 3+ vessels;  with the following complications: none.  Anesthesia:  epidural Episiotomy:  none Lacerations:  2nd Suture Repair: 2.0 vicryl Est. Blood Loss (mL):  300 mL  Mom to postpartum.  Baby to Couplet care / Skin to Skin.  Annamarie MajorPaul Aerilynn Goin, MD, Merlinda FrederickFACOG [facility] 03/14/2017  4:24 AM [phone]

## 2016-08-10 ENCOUNTER — Encounter: Payer: Self-pay | Admitting: Obstetrics & Gynecology

## 2016-08-10 ENCOUNTER — Ambulatory Visit (INDEPENDENT_AMBULATORY_CARE_PROVIDER_SITE_OTHER): Payer: Self-pay | Admitting: Obstetrics & Gynecology

## 2016-08-10 VITALS — BP 100/60 | Ht 62.0 in | Wt 115.0 lb

## 2016-08-10 DIAGNOSIS — Z3A10 10 weeks gestation of pregnancy: Secondary | ICD-10-CM

## 2016-08-10 DIAGNOSIS — Z3490 Encounter for supervision of normal pregnancy, unspecified, unspecified trimester: Secondary | ICD-10-CM

## 2016-08-10 DIAGNOSIS — N926 Irregular menstruation, unspecified: Secondary | ICD-10-CM

## 2016-08-10 LAB — POCT URINE PREGNANCY: Preg Test, Ur: POSITIVE — AB

## 2016-08-10 NOTE — Progress Notes (Signed)
08/10/2016   Chief Complaint: Missed period  Transfer of Care Patient: no  History of Present Illness: Vickie Ruiz is a 31 y.o. G1P0 [redacted]w[redacted]d based on Patient's last menstrual period was 05/26/2016. with an Estimated Date of Delivery: 03/02/17, with the above CC. Preg complicated by has Encounter to establish care; Acne; Costochondritis; Routine general medical examination at a health care facility; and Encounter for supervision of low-risk pregnancy, antepartum on her problem list..  Her periods were: regular periods every 35 days She was using no method when she conceived.  She has Positive signs or symptoms of nausea/vomiting of pregnancy. She has Negative signs or symptoms of miscarriage or preterm labor She identifies Negative Zika risk factors for her and her partner On any different medications around the time she conceived/early pregnancy: No  History of varicella: Yes   ROS: A 12-point review of systems was performed and negative, except as stated in the above HPI.  OBGYN History: As per HPI. OB History  Gravida Para Term Preterm AB Living  1            SAB TAB Ectopic Multiple Live Births               # Outcome Date GA Lbr Len/2nd Weight Sex Delivery Anes PTL Lv  1 Current               Any issues with any prior pregnancies: not applicable Any prior children are healthy, doing well, without any problems or issues: no History of pap smears: Yes. Last pap smear 1 year. Abnormal: no  History of STIs: No   Past Medical History: Past Medical History:  Diagnosis Date  . Arthritis   . Costochondritis     Past Surgical History: Past Surgical History:  Procedure Laterality Date  . WISDOM TOOTH EXTRACTION  2011    Family History:  Family History  Problem Relation Age of Onset  . Hypertension Mother   . Stroke Maternal Grandmother   . Cancer Maternal Grandfather     Lung  . Cancer Paternal Grandfather     lung   She denies any female cancers, bleeding or blood  clotting disorders.  She denies any history of mental retardation, birth defects or genetic disorders in her or the FOB's history  Social History:  Social History   Social History  . Marital status: Married    Spouse name: N/A  . Number of children: N/A  . Years of education: N/A   Occupational History  . Not on file.   Social History Main Topics  . Smoking status: Never Smoker  . Smokeless tobacco: Never Used  . Alcohol use 0.0 oz/week     Comment: occasionally  . Drug use: No  . Sexual activity: Yes    Partners: Male    Birth control/ protection: Pill     Comment: Husband    Other Topics Concern  . 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 5- 7 days a week    Herbal Remedies- Melatonin       Any pets in the household: no  Allergy: No Known Allergies  Current Outpatient Medications: No current outpatient prescriptions on file.   Physical Exam:   BP 100/60   Ht  (  1.575 m)   Wt 115 lb (52.2 kg)   LMP 05/26/2016 Comment: neg preg test 12/01/15  BMI 21.03 kg/m  Body mass index is 21.03 kg/m. Fundal height: not applicable FHTs: no  Constitutional: Well nourished, well developed female in no acute distress.  Neck:  Supple, normal appearance, and no thyromegaly  Cardiovascular: S1, S2 normal, no murmur, rub or gallop, regular rate and rhythm Respiratory:  Clear to auscultation bilateral. Normal respiratory effort Abdomen: positive bowel sounds and no masses, hernias; diffusely non tender to palpation, non distended Breasts: breasts appear normal, no suspicious masses, no skin or nipple changes or axillary nodes. Neuro/Psych:  Normal mood and affect.  Skin:  Warm and dry.  Lymphatic:  No inguinal lymphadenopathy.   Pelvic exam: is not limited by body habitus EGBUS: within normal limits, Vagina:  within normal limits and with no blood in the vault, Cervix: normal appearing cervix without discharge or lesions, closed/long/high, Uterus:  10 weeks anteverted, and Adnexa:  normal adnexa  Assessment: Vickie Ruiz is a 31 y.o. G1P0 [redacted]w[redacted]d based on Patient's last menstrual period was 05/26/2016. with an Estimated Date of Delivery: 03/02/17,  for prenatal care.  Plan: 1. [redacted] weeks gestation of pregnancy - ABO - Antibody screen - CBC - Hepatitis B surface antigen - HIV antibody - RPR - Rh Type - Rubella screen - Varicella zoster antibody, IgG - Culture, OB Urine - IGP,CtNgTv,rfx Aptima HPV ASCU - US OB Comp Less 14 Wks; Future  2. Irregular menses - US OB Comp Less 14 Wks; Future  3. 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 The patient has been advised to notify us if these factors change any time during this current pregnancy, so adequate testing and monitoring can be initiated.  Problem list reviewed and updated.  Follow up in 1 weeks. Annamarie Major, MD, [facility], [facility] 08/10/2016  10:13 AM

## 2016-08-10 NOTE — Addendum Note (Signed)
Addended by: Cornelius Moras D on: 08/10/2016 12:56 PM   Modules accepted: Orders

## 2016-08-10 NOTE — Addendum Note (Signed)
Addended by: Cornelius Moras D on: 08/10/2016 03:50 PM   Modules accepted: Orders

## 2016-08-10 NOTE — Patient Instructions (Signed)
First Trimester of Pregnancy The first trimester of pregnancy is from week 1 until the end of week 13 (months 1 through 3). A week after a sperm fertilizes an egg, the egg will implant on the wall of the uterus. This embryo will begin to develop into a baby. Genes from you and your partner will form the baby. The female genes will determine whether the baby will be a boy or a girl. At 6-8 weeks, the eyes and face will be formed, and the heartbeat can be seen on ultrasound. At the end of 12 weeks, all the baby's organs will be formed. Now that you are pregnant, you will want to do everything you can to have a healthy baby. Two of the most important things are to get good prenatal care and to follow your health care provider's instructions. Prenatal care is all the medical care you receive before the baby's birth. This care will help prevent, find, and treat any problems during the pregnancy and childbirth. Body changes during your first trimester Your body goes through many changes during pregnancy. The changes vary from woman to woman.  You may gain or lose a couple of pounds at first.  You may feel sick to your stomach (nauseous) and you may throw up (vomit). If the vomiting is uncontrollable, call your health care provider.  You may tire easily.  You may develop headaches that can be relieved by medicines. All medicines should be approved by your health care provider.  You may urinate more often. Painful urination may mean you have a bladder infection.  You may develop heartburn as a result of your pregnancy.  You may develop constipation because certain hormones are causing the muscles that push stool through your intestines to slow down.  You may develop hemorrhoids or swollen veins (varicose veins).  Your breasts may begin to grow larger and become tender. Your nipples may stick out more, and the tissue that surrounds them (areola) may become darker.  Your gums may bleed and may be  sensitive to brushing and flossing.  Dark spots or blotches (chloasma, mask of pregnancy) may develop on your face. This will likely fade after the baby is born.  Your menstrual periods will stop.  You may have a loss of appetite.  You may develop cravings for certain kinds of food.  You may have changes in your emotions from day to day, such as being excited to be pregnant or being concerned that something may go wrong with the pregnancy and baby.  You may have more vivid and strange dreams.  You may have changes in your hair. These can include thickening of your hair, rapid growth, and changes in texture. Some women also have hair loss during or after pregnancy, or hair that feels dry or thin. Your hair will most likely return to normal after your baby is born. What to expect at prenatal visits During a routine prenatal visit:  You will be weighed to make sure you and the baby are growing normally.  Your blood pressure will be taken.  Your abdomen will be measured to track your baby's growth.  The fetal heartbeat will be listened to between weeks 10 and 14 of your pregnancy.  Test results from any previous visits will be discussed. Your health care provider may ask you:  How you are feeling.  If you are feeling the baby move.  If you have had any abnormal symptoms, such as leaking fluid, bleeding, severe headaches, or abdominal   cramping.  If you are using any tobacco products, including cigarettes, chewing tobacco, and electronic cigarettes.  If you have any questions. Other tests that may be performed during your first trimester include:  Blood tests to find your blood type and to check for the presence of any previous infections. The tests will also be used to check for low iron levels (anemia) and protein on red blood cells (Rh antibodies). Depending on your risk factors, or if you previously had diabetes during pregnancy, you may have tests to check for high blood sugar  that affects pregnant women (gestational diabetes).  Urine tests to check for infections, diabetes, or protein in the urine.  An ultrasound to confirm the proper growth and development of the baby.  Fetal screens for spinal cord problems (spina bifida) and Down syndrome.  HIV (human immunodeficiency virus) testing. Routine prenatal testing includes screening for HIV, unless you choose not to have this test.  You may need other tests to make sure you and the baby are doing well. Follow these instructions at home: Medicines   Follow your health care provider's instructions regarding medicine use. Specific medicines may be either safe or unsafe to take during pregnancy.  Take a prenatal vitamin that contains at least 600 micrograms (mcg) of folic acid.  If you develop constipation, try taking a stool softener if your health care provider approves. Eating and drinking   Eat a balanced diet that includes fresh fruits and vegetables, whole grains, good sources of protein such as meat, eggs, or tofu, and low-fat dairy. Your health care provider will help you determine the amount of weight gain that is right for you.  Avoid raw meat and uncooked cheese. These carry germs that can cause birth defects in the baby.  Eating four or five small meals rather than three large meals a day may help relieve nausea and vomiting. If you start to feel nauseous, eating a few soda crackers can be helpful. Drinking liquids between meals, instead of during meals, also seems to help ease nausea and vomiting.  Limit foods that are high in fat and processed sugars, such as fried and sweet foods.  To prevent constipation:  Eat foods that are high in fiber, such as fresh fruits and vegetables, whole grains, and beans.  Drink enough fluid to keep your urine clear or pale yellow. Activity   Exercise only as directed by your health care provider. Most women can continue their usual exercise routine during  pregnancy. Try to exercise for 30 minutes at least 5 days a week. Exercising will help you:  Control your weight.  Stay in shape.  Be prepared for labor and delivery.  Experiencing pain or cramping in the lower abdomen or lower back is a good sign that you should stop exercising. Check with your health care provider before continuing with normal exercises.  Try to avoid standing for long periods of time. Move your legs often if you must stand in one place for a long time.  Avoid heavy lifting.  Wear low-heeled shoes and practice good posture.  You may continue to have sex unless your health care provider tells you not to. Relieving pain and discomfort   Wear a good support bra to relieve breast tenderness.  Take warm sitz baths to soothe any pain or discomfort caused by hemorrhoids. Use hemorrhoid cream if your health care provider approves.  Rest with your legs elevated if you have leg cramps or low back pain.  If you develop   varicose veins in your legs, wear support hose. Elevate your feet for 15 minutes, 3-4 times a day. Limit salt in your diet. Prenatal care   Schedule your prenatal visits by the twelfth week of pregnancy. They are usually scheduled monthly at first, then more often in the last 2 months before delivery.  Write down your questions. Take them to your prenatal visits.  Keep all your prenatal visits as told by your health care provider. This is important. Safety   Wear your seat belt at all times when driving.  Make a list of emergency phone numbers, including numbers for family, friends, the hospital, and police and fire departments. General instructions   Ask your health care provider for a referral to a local prenatal education class. Begin classes no later than the beginning of month 6 of your pregnancy.  Ask for help if you have counseling or nutritional needs during pregnancy. Your health care provider can offer advice or refer you to specialists for  help with various needs.  Do not use hot tubs, steam rooms, or saunas.  Do not douche or use tampons or scented sanitary pads.  Do not cross your legs for long periods of time.  Avoid cat litter boxes and soil used by cats. These carry germs that can cause birth defects in the baby and possibly loss of the fetus by miscarriage or stillbirth.  Avoid all smoking, herbs, alcohol, and medicines not prescribed by your health care provider. Chemicals in these products affect the formation and growth of the baby.  Do not use any products that contain nicotine or tobacco, such as cigarettes and e-cigarettes. If you need help quitting, ask your health care provider. You may receive counseling support and other resources to help you quit.  Schedule a dentist appointment. At home, brush your teeth with a soft toothbrush and be gentle when you floss. Contact a health care provider if:  You have dizziness.  You have mild pelvic cramps, pelvic pressure, or nagging pain in the abdominal area.  You have persistent nausea, vomiting, or diarrhea.  You have a bad smelling vaginal discharge.  You have pain when you urinate.  You notice increased swelling in your face, hands, legs, or ankles.  You are exposed to fifth disease or chickenpox.  You are exposed to German measles (rubella) and have never had it. Get help right away if:  You have a fever.  You are leaking fluid from your vagina.  You have spotting or bleeding from your vagina.  You have severe abdominal cramping or pain.  You have rapid weight gain or loss.  You vomit blood or material that looks like coffee grounds.  You develop a severe headache.  You have shortness of breath.  You have any kind of trauma, such as from a fall or a car accident. Summary  The first trimester of pregnancy is from week 1 until the end of week 13 (months 1 through 3).  Your body goes through many changes during pregnancy. The changes vary  from woman to woman.  You will have routine prenatal visits. During those visits, your health care provider will examine you, discuss any test results you may have, and talk with you about how you are feeling. This information is not intended to replace advice given to you by your health care provider. Make sure you discuss any questions you have with your health care provider. Document Released: 03/30/2001 Document Revised: 03/17/2016 Document Reviewed: 03/17/2016 Elsevier Interactive Patient Education    2017 Else[employer].   Commonly Asked Questions During Pregnancy  Cats: A parasite can be excreted in cat feces.  To avoid exposure you need to have another person empty the little box.  If you must empty the litter box you will need to wear gloves.  Wash your hands after handling your cat.  This parasite can also be found in raw or undercooked meat so this should also be avoided.  Colds, Sore Throats, Flu: Please check your medication sheet to see what you can take for symptoms.  If your symptoms are unrelieved by these medications please call the office.  Dental Work: Most any dental work Agricultural consultant recommends is permitted.  X-rays should only be taken during the first trimester if absolutely necessary.  Your abdomen should be shielded with a lead apron during all x-rays.  Please notify your provider prior to receiving any x-rays.  Novocaine is fine; gas is not recommended.  If your dentist requires a note from [place] prior to dental work please call the office and we will provide one for you.  Exercise: Exercise is an important part of staying healthy during your pregnancy.  You may continue most exercises you were accustomed to prior to pregnancy.  Later in your pregnancy you will most likely notice you have difficulty with activities requiring balance like riding a bicycle.  It is important that you listen to your body and avoid activities that put you at a higher risk of falling.  Adequate rest  and staying well hydrated are a must!  If you have questions about the safety of specific activities ask your provider.    Exposure to Children with illness: Try to avoid obvious exposure; report any symptoms to [place] when noted,  If you have chicken pos, red measles or mumps, you should be immune to these diseases.   Please do not take any vaccines while pregnant unless you have checked with your OB provider.  Fetal Movement: After 28 weeks we recommend you do "kick counts" twice daily.  Lie or sit down in a calm quiet environment and count your baby movements "kicks".  You should feel your baby at least 10 times per hour.  If you have not felt 10 kicks within the first hour get up, walk around and have something sweet to eat or drink then repeat for an additional hour.  If count remains less than 10 per hour notify your provider.  Fumigating: Follow your pest control agent's advice as to how long to stay out of your home.  Ventilate the area well before re-entering.  Hemorrhoids:   Most over-the-counter preparations can be used during pregnancy.  Check your medication to see what is safe to use.  It is important to use a stool softener or fiber in your diet and to drink lots of liquids.  If hemorrhoids seem to be getting worse please call the office.   Hot Tubs:  Hot tubs Jacuzzis and saunas are not recommended while pregnant.  These increase your internal body temperature and should be avoided.  Intercourse:  Sexual intercourse is safe during pregnancy as long as you are comfortable, unless otherwise advised by your provider.  Spotting may occur after intercourse; report any bright red bleeding that is heavier than spotting.  Labor:  If you know that you are in labor, please go to the hospital.  If you are unsure, please call the office and let us help you decide what to do.  Lifting, straining, etc:  requires heavy lifting or straining please check with your provider for any limitations.   Generally, you should not lift items heavier than that you can lift simply with your hands and arms (no back muscles)  Painting:  Paint fumes do not harm your pregnancy, but may make you ill and should be avoided if possible.  Latex or water based paints have less odor than oils.  Use adequate ventilation while painting.  Permanents & Hair Color:  Chemicals in hair dyes are not recommended as they cause increase hair dryness which can increase hair loss during pregnancy.  " Highlighting" and permanents are allowed.  Dye may be absorbed differently and permanents may not hold as well during pregnancy.  Sunbathing:  Use a sunscreen, as skin burns easily during pregnancy.  Drink plenty of fluids; avoid over heating.  Tanning Beds:  Because their possible side effects are still unknown, tanning beds are not recommended.  Ultrasound Scans:  Routine ultrasounds are performed at approximately 20 weeks.  You will be able to see your baby's general anatomy an if you would like to know the gender this can usually be determined as well.  If it is questionable when you conceived you may also receive an ultrasound early in your pregnancy for dating purposes.  Otherwise ultrasound exams are not routinely performed unless there is a medical necessity.  Although you can request a scan we ask that you pay for it when conducted because insurance does not cover " patient request" scans.  Work: If your pregnancy proceeds without complications you may work until your due date, unless your physician or employer advises otherwise.  Round Ligament Pain/Pelvic Discomfort:  Sharp, shooting pains not associated with bleeding are fairly common, usually occurring in the second trimester of pregnancy.  They tend to be worse when standing up or when you remain standing for long periods of time.  These are the result of pressure of certain pelvic ligaments called "round ligaments".  Rest, Tylenol and heat seem to be the most  effective relief.  As the womb and fetus grow, they rise out of the pelvis and the discomfort improves.  Please notify the office if your pain seems different than that described.  It may represent a more serious condition.   

## 2016-08-10 NOTE — Addendum Note (Signed)
Addended by: Cornelius Moras D on: 08/10/2016 03:53 PM   Modules accepted: Orders

## 2016-08-11 LAB — CBC
Hematocrit: 37.8 % (ref 34.0–46.6)
Hemoglobin: 13.3 g/dL (ref 11.1–15.9)
MCH: 31.4 pg (ref 26.6–33.0)
MCHC: 35.2 g/dL (ref 31.5–35.7)
MCV: 89 fL (ref 79–97)
PLATELETS: 192 10*3/uL (ref 150–379)
RBC: 4.24 x10E6/uL (ref 3.77–5.28)
RDW: 12.3 % (ref 12.3–15.4)
WBC: 6 10*3/uL (ref 3.4–10.8)

## 2016-08-11 LAB — VARICELLA ZOSTER ANTIBODY, IGG: Varicella zoster IgG: 3033 index (ref >165)

## 2016-08-11 LAB — RPR: RPR Ser Ql: NONREACTIVE

## 2016-08-11 LAB — HEPATITIS B SURFACE ANTIGEN: HEP B S AG: NEGATIVE

## 2016-08-11 LAB — RH TYPE: Rh Factor: POSITIVE

## 2016-08-11 LAB — ANTIBODY SCREEN: ANTIBODY SCREEN: NEGATIVE

## 2016-08-11 LAB — ABO

## 2016-08-11 LAB — HIV ANTIBODY (ROUTINE TESTING W REFLEX): HIV SCREEN 4TH GENERATION: NONREACTIVE

## 2016-08-11 LAB — RUBELLA SCREEN: Rubella Antibodies, IGG: 6.58 index (ref >0.99)

## 2016-08-12 LAB — IGP,CTNGTV,RFX APTIMA HPV ASCU
Chlamydia, Nuc. Acid Amp: NEGATIVE
Gonococcus, Nuc. Acid Amp: NEGATIVE
PAP SMEAR COMMENT: 0
TRICH VAG BY NAA: NEGATIVE

## 2016-08-12 LAB — URINE CULTURE: Organism ID, Bacteria: NO GROWTH

## 2016-08-16 ENCOUNTER — Ambulatory Visit (INDEPENDENT_AMBULATORY_CARE_PROVIDER_SITE_OTHER): Payer: BLUE CROSS/BLUE SHIELD | Admitting: Obstetrics & Gynecology

## 2016-08-16 ENCOUNTER — Ambulatory Visit (INDEPENDENT_AMBULATORY_CARE_PROVIDER_SITE_OTHER): Payer: BLUE CROSS/BLUE SHIELD

## 2016-08-16 VITALS — BP 98/60 | Wt 114.0 lb

## 2016-08-16 DIAGNOSIS — N926 Irregular menstruation, unspecified: Secondary | ICD-10-CM | POA: Diagnosis not present

## 2016-08-16 DIAGNOSIS — Z3490 Encounter for supervision of normal pregnancy, unspecified, unspecified trimester: Secondary | ICD-10-CM

## 2016-08-16 DIAGNOSIS — Z3A09 9 weeks gestation of pregnancy: Secondary | ICD-10-CM

## 2016-08-16 DIAGNOSIS — Z3A10 10 weeks gestation of pregnancy: Secondary | ICD-10-CM

## 2016-08-16 DIAGNOSIS — Z1371 Encounter for nonprocreative screening for genetic disease carrier status: Secondary | ICD-10-CM

## 2016-08-16 NOTE — Patient Instructions (Addendum)
Z13.71

## 2016-08-16 NOTE — Progress Notes (Signed)
[place]- change EDC Genetics nv (cfDNA planned- [facility]) PNV

## 2016-08-18 ENCOUNTER — Encounter: Payer: Self-pay | Admitting: Certified Nurse Midwife

## 2016-08-30 ENCOUNTER — Encounter: Payer: Self-pay | Admitting: Obstetrics and Gynecology

## 2016-08-30 ENCOUNTER — Ambulatory Visit (INDEPENDENT_AMBULATORY_CARE_PROVIDER_SITE_OTHER): Payer: BLUE CROSS/BLUE SHIELD | Admitting: Obstetrics and Gynecology

## 2016-08-30 VITALS — BP 110/70 | Wt 115.0 lb

## 2016-08-30 DIAGNOSIS — Z1379 Encounter for other screening for genetic and chromosomal anomalies: Secondary | ICD-10-CM

## 2016-08-30 DIAGNOSIS — Z3490 Encounter for supervision of normal pregnancy, unspecified, unspecified trimester: Secondary | ICD-10-CM

## 2016-08-30 DIAGNOSIS — Z3A11 11 weeks gestation of pregnancy: Secondary | ICD-10-CM

## 2016-08-30 NOTE — Progress Notes (Signed)
No vb. No lof. NIPT today.

## 2016-09-01 ENCOUNTER — Telehealth: Payer: Self-pay

## 2016-09-01 NOTE — Telephone Encounter (Signed)
[place] [facility] [facility] and [facility] on 08/30/16.  [facility] and GA on 5/14 was 11.1.

## 2016-09-05 LAB — INFORMASEQ(SM) WITH XY ANALYSIS
Fetal Fraction (%):: 9.4
Fetal Number: 1
Gestational Age at Collection: 11.1 weeks
WEIGHT: 115 [lb_av]

## 2016-09-07 ENCOUNTER — Telehealth: Payer: Self-pay | Admitting: Obstetrics and Gynecology

## 2016-09-07 ENCOUNTER — Encounter: Payer: Self-pay | Admitting: Obstetrics & Gynecology

## 2016-09-07 NOTE — Telephone Encounter (Signed)
Left generic VM regarding informaseq results

## 2016-09-10 ENCOUNTER — Telehealth: Payer: Self-pay | Admitting: Obstetrics and Gynecology

## 2016-09-10 ENCOUNTER — Encounter: Payer: Self-pay | Admitting: Obstetrics and Gynecology

## 2016-09-10 NOTE — Telephone Encounter (Signed)
Left generic VM. Explained that I would make sure to send her a note through MyChart.  If she has any questions, she is welcome to call me back.

## 2016-09-10 NOTE — Telephone Encounter (Signed)
Left generic vm 

## 2016-09-10 NOTE — Telephone Encounter (Signed)
Pt is calling back due to missed call from Dr. Jean RosenthalJackson. Please call pt.

## 2016-09-14 NOTE — Telephone Encounter (Signed)
Called Nad left voicemail to follow up with patient to make sure she was able to connect with Dr. Jean RosenthalJackson

## 2016-09-28 ENCOUNTER — Ambulatory Visit (INDEPENDENT_AMBULATORY_CARE_PROVIDER_SITE_OTHER): Payer: BLUE CROSS/BLUE SHIELD | Admitting: Obstetrics and Gynecology

## 2016-09-28 VITALS — BP 108/68 | Wt 118.0 lb

## 2016-09-28 DIAGNOSIS — Z3A15 15 weeks gestation of pregnancy: Secondary | ICD-10-CM

## 2016-09-28 DIAGNOSIS — Z363 Encounter for antenatal screening for malformations: Secondary | ICD-10-CM

## 2016-09-28 DIAGNOSIS — Z3490 Encounter for supervision of normal pregnancy, unspecified, unspecified trimester: Secondary | ICD-10-CM

## 2016-09-28 NOTE — Progress Notes (Signed)
Anatomy scan next visit 

## 2016-09-28 NOTE — Progress Notes (Signed)
On ABX for BV

## 2016-10-06 ENCOUNTER — Encounter: Payer: Self-pay | Admitting: Obstetrics and Gynecology

## 2016-10-26 ENCOUNTER — Ambulatory Visit (INDEPENDENT_AMBULATORY_CARE_PROVIDER_SITE_OTHER): Payer: BLUE CROSS/BLUE SHIELD | Admitting: Obstetrics and Gynecology

## 2016-10-26 ENCOUNTER — Ambulatory Visit (INDEPENDENT_AMBULATORY_CARE_PROVIDER_SITE_OTHER): Payer: BLUE CROSS/BLUE SHIELD

## 2016-10-26 VITALS — BP 104/62 | Wt 123.0 lb

## 2016-10-26 DIAGNOSIS — Z3A19 19 weeks gestation of pregnancy: Secondary | ICD-10-CM

## 2016-10-26 DIAGNOSIS — Z3490 Encounter for supervision of normal pregnancy, unspecified, unspecified trimester: Secondary | ICD-10-CM

## 2016-10-26 DIAGNOSIS — Z363 Encounter for antenatal screening for malformations: Secondary | ICD-10-CM | POA: Diagnosis not present

## 2016-10-26 NOTE — Progress Notes (Signed)
Anatomy scan today complete. No vb. No lof.

## 2016-11-23 ENCOUNTER — Ambulatory Visit (INDEPENDENT_AMBULATORY_CARE_PROVIDER_SITE_OTHER): Payer: BLUE CROSS/BLUE SHIELD | Admitting: Obstetrics and Gynecology

## 2016-11-23 VITALS — BP 110/68 | Wt 127.0 lb

## 2016-11-23 DIAGNOSIS — Z131 Encounter for screening for diabetes mellitus: Secondary | ICD-10-CM

## 2016-11-23 DIAGNOSIS — Z113 Encounter for screening for infections with a predominantly sexual mode of transmission: Secondary | ICD-10-CM

## 2016-11-23 DIAGNOSIS — Z3A23 23 weeks gestation of pregnancy: Secondary | ICD-10-CM

## 2016-11-23 DIAGNOSIS — Z3490 Encounter for supervision of normal pregnancy, unspecified, unspecified trimester: Secondary | ICD-10-CM

## 2016-11-23 NOTE — Progress Notes (Signed)
Prenatal Visit Note Date: 11/23/2016 Clinic: [facility]  Subjective:  Vickie Ruiz is a 31 y.o. G1P0 at 3056w2d being seen today for ongoing prenatal care.  She is currently monitored for the following issues for this low-risk pregnancy and has Encounter to establish care; Acne; Costochondritis; and Encounter for supervision of low-risk pregnancy, antepartum on her problem list.   ----------------------------------------------------------------------------------- Patient reports no bleeding, no contractions and no leaking.   Contractions: Not present. Vag. Bleeding: None.  Movement: Present. Denies leaking of fluid.  ----------------------------------------------------------------------------------- The following portions of the patient's history were reviewed and updated as appropriate: allergies, current medications, past family history, past medical history, past social history, past surgical history and problem list. Problem list updated.  Objective:   Vitals:   11/23/16 1618  BP: 110/68  Weight: 127 lb (57.6 kg)   Total weight gain for pregnancy: 16 lb (7.258 kg)   Fetal Status: Fetal Heart Rate (bpm): 135   Movement: Present     General:  Alert, oriented and cooperative. Patient is in no acute distress.  Skin: Skin is warm and dry. No rash noted.   Cardiovascular: Normal heart rate noted  Respiratory: Normal respiratory effort, no problems with respiration noted  Abdomen: Soft, gravid, appropriate for gestational age. Pain/Pressure: Absent     Pelvic:  Cervical exam deferred        Extremities: Normal range of motion.     Mental Status: Normal mood and affect. Normal behavior. Normal judgment and thought content.   Urinalysis: Urine Protein: Negative Urine Glucose: Negative  Assessment and Plan:  Pregnancy: G1P0 at 3056w2d  1. Encounter for supervision of low-risk pregnancy, antepartum - 28 Week RH+Panel; Future 2. [redacted] weeks gestation of pregnancy 3. Screening for  diabetes mellitus - 28 Week RH+Panel; Future 4. Screen for STD (sexually transmitted disease) - 28 Week RH+Panel; Future  Preterm labor symptoms and general obstetric precautions including but not limited to vaginal bleeding, contractions, leaking of fluid and fetal movement were reviewed in detail with the patient. Please refer to After Visit Summary for other counseling recommendations.   Return in about 4 weeks (around 12/21/2016) for schedule 1 hour gtt and ROB.  Thomasene MohairStephen Yukiko Minnich, MD 11/23/2016 5:08 PM

## 2016-12-13 ENCOUNTER — Telehealth: Payer: Self-pay

## 2016-12-13 NOTE — Telephone Encounter (Signed)
Pt sched for 1hr gtt 9/6, does she need to fast?  [phone]  Left detailed msg that she does not need to fast but to eat protein as opposed to carbs, skip desserts, sweet tea.

## 2016-12-23 ENCOUNTER — Other Ambulatory Visit: Payer: BLUE CROSS/BLUE SHIELD

## 2016-12-23 ENCOUNTER — Ambulatory Visit (INDEPENDENT_AMBULATORY_CARE_PROVIDER_SITE_OTHER): Payer: BLUE CROSS/BLUE SHIELD | Admitting: Obstetrics and Gynecology

## 2016-12-23 VITALS — BP 112/74 | Wt 132.0 lb

## 2016-12-23 DIAGNOSIS — Z113 Encounter for screening for infections with a predominantly sexual mode of transmission: Secondary | ICD-10-CM

## 2016-12-23 DIAGNOSIS — Z3A27 27 weeks gestation of pregnancy: Secondary | ICD-10-CM

## 2016-12-23 DIAGNOSIS — Z131 Encounter for screening for diabetes mellitus: Secondary | ICD-10-CM

## 2016-12-23 DIAGNOSIS — Z3490 Encounter for supervision of normal pregnancy, unspecified, unspecified trimester: Secondary | ICD-10-CM

## 2016-12-23 NOTE — Progress Notes (Signed)
  Routine Prenatal Care Visit  Subjective  Vickie Ruiz is a 31 y.o. G1P0 at 3162w4d being seen today for ongoing prenatal care.  She is currently monitored for the following issues for this low-risk pregnancy and has Encounter to establish care; Acne; Costochondritis; and Encounter for supervision of low-risk pregnancy, antepartum on her problem list.  ----------------------------------------------------------------------------------- Patient reports no complaints.   Contractions: Not present. Vag. Bleeding: None.  Movement: Present. Denies leaking of fluid.  1h gtt today with 28 week labs ----------------------------------------------------------------------------------- The following portions of the patient's history were reviewed and updated as appropriate: allergies, current medications, past family history, past medical history, past social history, past surgical history and problem list. Problem list updated.  Objective  Blood pressure 112/74, weight 132 lb (59.9 kg), last menstrual period 05/26/2016. Pregravid weight 111 lb (50.3 kg) Total Weight Gain 21 lb (9.526 kg) Urinalysis:      Fetal Status: Fetal Heart Rate (bpm): 135   Movement: Present     General:  Alert, oriented and cooperative. Patient is in no acute distress.  Skin: Skin is warm and dry. No rash noted.   Cardiovascular: Normal heart rate noted  Respiratory: Normal respiratory effort, no problems with respiration noted  Abdomen: Soft, gravid, appropriate for gestational age. Pain/Pressure: Absent     Pelvic:  Cervical exam deferred        Extremities: Normal range of motion.     Mental Status: Normal mood and affect. Normal behavior. Normal judgment and thought content.   Assessment   31 y.o. G1P0 at 3262w4d by  03/20/2017, by Ultrasound presenting for routine prenatal visit  Plan   pregnancy 1 Problems (from 05/26/16 to present)    No problems associated with this episode.     Preterm labor symptoms and  general obstetric precautions including but not limited to vaginal bleeding, contractions, leaking of fluid and fetal movement were reviewed in detail with the patient. Please refer to After Visit Summary for other counseling recommendations.   Return in about 2 weeks (around 01/06/2017) for Routine Prenatal Appointment.  Thomasene MohairStephen Elishah Ashmore, MD  12/23/2016 3:55 PM

## 2016-12-24 LAB — 28 WEEK RH+PANEL
BASOS ABS: 0 10*3/uL (ref 0.0–0.2)
Basos: 0 %
EOS (ABSOLUTE): 0.1 10*3/uL (ref 0.0–0.4)
Eos: 1 %
GESTATIONAL DIABETES SCREEN: 132 mg/dL (ref 65–139)
HIV Screen 4th Generation wRfx: NONREACTIVE
Hematocrit: 32.7 % — ABNORMAL LOW (ref 34.0–46.6)
Hemoglobin: 11 g/dL — ABNORMAL LOW (ref 11.1–15.9)
IMMATURE GRANULOCYTES: 2 %
Immature Grans (Abs): 0.2 10*3/uL — ABNORMAL HIGH (ref 0.0–0.1)
Lymphocytes Absolute: 2.1 10*3/uL (ref 0.7–3.1)
Lymphs: 21 %
MCH: 32.6 pg (ref 26.6–33.0)
MCHC: 33.6 g/dL (ref 31.5–35.7)
MCV: 97 fL (ref 79–97)
MONOS ABS: 0.8 10*3/uL (ref 0.1–0.9)
Monocytes: 8 %
NEUTROS PCT: 68 %
Neutrophils Absolute: 6.7 10*3/uL (ref 1.4–7.0)
PLATELETS: 159 10*3/uL (ref 150–379)
RBC: 3.37 x10E6/uL — ABNORMAL LOW (ref 3.77–5.28)
RDW: 13.2 % (ref 12.3–15.4)
RPR: NONREACTIVE
WBC: 9.9 10*3/uL (ref 3.4–10.8)

## 2017-01-07 ENCOUNTER — Ambulatory Visit (INDEPENDENT_AMBULATORY_CARE_PROVIDER_SITE_OTHER): Payer: BLUE CROSS/BLUE SHIELD | Admitting: Maternal Newborn

## 2017-01-07 VITALS — BP 96/60 | Wt 134.0 lb

## 2017-01-07 DIAGNOSIS — Z3A29 29 weeks gestation of pregnancy: Secondary | ICD-10-CM

## 2017-01-07 DIAGNOSIS — Z3490 Encounter for supervision of normal pregnancy, unspecified, unspecified trimester: Secondary | ICD-10-CM

## 2017-01-07 NOTE — Progress Notes (Signed)
    Routine Prenatal Care Visit  Subjective  Vickie Ruiz is a 31 y.o. G1P0 at [redacted]w[redacted]d being seen today for ongoing prenatal care.  She is currently monitored for the following issues for this low-risk pregnancy and has Encounter to establish care; Acne; Costochondritis; and Encounter for supervision of low-risk pregnancy, antepartum on her problem list.  ----------------------------------------------------------------------------------- Patient reports no complaints.   Contractions: Not present. Vag. Bleeding: None.  Movement: Present. Denies leaking of fluid.  ----------------------------------------------------------------------------------- The following portions of the patient's history were reviewed and updated as appropriate: allergies, current medications, past family history, past medical history, past social history, past surgical history and problem list. Problem list updated.   Objective  Blood pressure 96/60, weight 134 lb (60.8 kg), last menstrual period 05/26/2016. Pregravid weight 111 lb (50.3 kg) Total Weight Gain 23 lb (10.4 kg) Urinalysis: Urine Protein: Negative Urine Glucose: Trace  Fetal Status: Fetal Heart Rate (bpm): 132   Movement: Present     General:  Alert, oriented and cooperative. Patient is in no acute distress.  Skin: Skin is warm and dry. No rash noted.   Cardiovascular: Normal heart rate noted  Respiratory: Normal respiratory effort, no problems with respiration noted  Abdomen: Soft, gravid, appropriate for gestational age. Pain/Pressure: Absent     Pelvic:  Cervical exam deferred        Extremities: Normal range of motion.     Mental Status: Normal mood and affect. Normal behavior. Normal judgment and thought content.     Assessment   31 y.o. G1P0 at [redacted]w[redacted]d by  03/20/2017, by Ultrasound presenting for routine prenatal visit.  Plan   pregnancy 1 Problems (from 05/26/16 to present)    No problems associated with this episode.    Patient reports  she is taking iron. Reviewed safe OTC medications for constipation. She had a flu vaccine on 9/20 at work.  Preterm labor symptoms and general obstetric precautions including but not limited to vaginal bleeding, contractions, leaking of fluid and fetal movement were reviewed in detail with the patient.  Return in about 2 weeks (around 01/21/2017) for ROB.  Marcelyn Bruins, CNM 01/07/2017  5:17 PM

## 2017-01-21 ENCOUNTER — Ambulatory Visit (INDEPENDENT_AMBULATORY_CARE_PROVIDER_SITE_OTHER): Payer: BLUE CROSS/BLUE SHIELD | Admitting: Obstetrics and Gynecology

## 2017-01-21 VITALS — BP 90/62 | Wt 136.0 lb

## 2017-01-21 DIAGNOSIS — Z23 Encounter for immunization: Secondary | ICD-10-CM

## 2017-01-21 DIAGNOSIS — Z3A31 31 weeks gestation of pregnancy: Secondary | ICD-10-CM

## 2017-01-21 DIAGNOSIS — Z3490 Encounter for supervision of normal pregnancy, unspecified, unspecified trimester: Secondary | ICD-10-CM

## 2017-01-21 NOTE — Progress Notes (Signed)
ROB Ab. Muscles feel real tight  TDAP/Blood form signed

## 2017-01-21 NOTE — Progress Notes (Signed)
    Routine Prenatal Care Visit  Subjective  Vickie Ruiz is a 31 y.o. G1P0 at [redacted]w[redacted]d being seen today for ongoing prenatal care.  She is currently monitored for the following issues for this low-risk pregnancy and has Encounter to establish care; Acne; Costochondritis; and Encounter for supervision of low-risk pregnancy, antepartum on her problem list.  ----------------------------------------------------------------------------------- Patient reports no complaints.  Some sharp midline abdominal pain, positional Contractions: Not present. Vag. Bleeding: None.  Movement: Present. Denies leaking of fluid.  ----------------------------------------------------------------------------------- The following portions of the patient's history were reviewed and updated as appropriate: allergies, current medications, past family history, past medical history, past social history, past surgical history and problem list. Problem list updated.   Objective  Blood pressure 90/62, weight 136 lb (61.7 kg), last menstrual period 05/26/2016. Pregravid weight 111 lb (50.3 kg) Total Weight Gain 25 lb (11.3 kg) Urinalysis: Urine Protein: Negative Urine Glucose: Negative  Fetal Status: Fetal Heart Rate (bpm): 140   Movement: Present     General:  Alert, oriented and cooperative. Patient is in no acute distress.  Skin: Skin is warm and dry. No rash noted.   Cardiovascular: Normal heart rate noted  Respiratory: Normal respiratory effort, no problems with respiration noted  Abdomen: Soft, gravid, appropriate for gestational age. Pain/Pressure: Absent   Mild diastasis rectus  Pelvic:  Cervical exam deferred        Extremities: Normal range of motion.     ental Status: Normal mood and affect. Normal behavior. Normal judgment and thought content.     Assessment   31 y.o. G1P0 at [redacted]w[redacted]d by  03/20/2017, by Ultrasound presenting for routine prenatal visit  Plan   pregnancy 1 Problems (from 05/26/16 to present)     No problems associated with this episode.       Preterm labor symptoms and general obstetric precautions including but not limited to vaginal bleeding, contractions, leaking of fluid and fetal movement were reviewed in detail with the patient. Please refer to After Visit Summary for other counseling recommendations.   Return in about 2 weeks (around 02/04/2017) for ROB.

## 2017-02-03 ENCOUNTER — Ambulatory Visit (INDEPENDENT_AMBULATORY_CARE_PROVIDER_SITE_OTHER): Payer: BLUE CROSS/BLUE SHIELD | Admitting: Obstetrics and Gynecology

## 2017-02-03 VITALS — BP 98/64 | Wt 139.0 lb

## 2017-02-03 DIAGNOSIS — Z3490 Encounter for supervision of normal pregnancy, unspecified, unspecified trimester: Secondary | ICD-10-CM

## 2017-02-03 DIAGNOSIS — Z3A33 33 weeks gestation of pregnancy: Secondary | ICD-10-CM

## 2017-02-03 NOTE — Progress Notes (Signed)
  Routine Prenatal Care Visit  Subjective  Vickie BackerJennifer Ruiz is a 31 y.o. G1P0 at 4115w4d being seen today for ongoing prenatal care.  She is currently monitored for the following issues for this low-risk pregnancy and has Encounter to establish care; Acne; Costochondritis; and Encounter for supervision of low-risk pregnancy, antepartum on her problem list.  ----------------------------------------------------------------------------------- Patient reports no complaints.   Contractions: Not present. Vag. Bleeding: None.  Movement: Present. Denies leaking of fluid.  ----------------------------------------------------------------------------------- The following portions of the patient's history were reviewed and updated as appropriate: allergies, current medications, past family history, past medical history, past social history, past surgical history and problem list. Problem list updated. Objective  Blood pressure 98/64, weight 139 lb (63 kg), last menstrual period 05/26/2016. Pregravid weight 111 lb (50.3 kg) Total Weight Gain 28 lb (12.7 kg) Urinalysis: Urine Protein: Negative Urine Glucose: Negative  Fetal Status: Fetal Heart Rate (bpm): 125 Fundal Height: 33 cm Movement: Present     General:  Alert, oriented and cooperative. Patient is in no acute distress.  Skin: Skin is warm and dry. No rash noted.   Cardiovascular: Normal heart rate noted  Respiratory: Normal respiratory effort, no problems with respiration noted  Abdomen: Soft, gravid, appropriate for gestational age. Pain/Pressure: Absent     Pelvic:  Cervical exam deferred        Extremities: Normal range of motion.     Mental Status: Normal mood and affect. Normal behavior. Normal judgment and thought content.   Assessment   31 y.o. G1P0 at 3315w4d by  03/20/2017, by Ultrasound presenting for routine prenatal visit  Plan   pregnancy 1 Problems (from 05/26/16 to present)    Problem Noted Resolved   Encounter for supervision of  low-risk pregnancy, antepartum 08/10/2016 by Nadara MustardHarris, Robert P, MD No   Overview Addendum 01/21/2017  4:21 PM by Vena AustriaStaebler, Andreas, MD    Clinic [facility] Prenatal Labs  Dating 9 week u/s Blood type: A/Positive/-- (04/24 1027)   Genetic Screen AFP:   NIPS:negative XY Antibody:Negative (04/24 1027)  Anatomic US Complete 19 weeks Rubella: 6.58 (04/24 1027) Varicella: I  GTT Early:               Third trimester:  RPR: Non Reactive (04/24 1027)   Rhogam N/A HBsAg: Negative (04/24 1027)   TDaP vaccine 01/21/17   Flu Shot: received outside of clinic HIV: Non Reactive (04/24 1027)   Baby Food                                GBS:   Contraception  Pap:  CBB     CS/VBAC    Support Person            Preterm labor symptoms and general obstetric precautions including but not limited to vaginal bleeding, contractions, leaking of fluid and fetal movement were reviewed in detail with the patient. Please refer to After Visit Summary for other counseling recommendations.   Return in about 2 weeks (around 02/17/2017) for Routine Prenatal Appointment.  Thomasene MohairStephen Beyounce Dickens, MD  02/03/2017 5:06 PM

## 2017-02-17 ENCOUNTER — Ambulatory Visit (INDEPENDENT_AMBULATORY_CARE_PROVIDER_SITE_OTHER): Payer: BLUE CROSS/BLUE SHIELD | Admitting: Certified Nurse Midwife

## 2017-02-17 VITALS — BP 90/60 | HR 67 | Wt 137.0 lb

## 2017-02-17 DIAGNOSIS — Z3490 Encounter for supervision of normal pregnancy, unspecified, unspecified trimester: Secondary | ICD-10-CM

## 2017-02-17 DIAGNOSIS — Z3A35 35 weeks gestation of pregnancy: Secondary | ICD-10-CM

## 2017-02-17 NOTE — Progress Notes (Signed)
Pt reports no problems. FKC's given.

## 2017-02-17 NOTE — Progress Notes (Signed)
ROB at 35wk4d. Some BH contractions. Baby active. Desires to breast feed Contraceptive options discussed.  Fkc instructions given ROB/ GBS 1 week

## 2017-02-22 ENCOUNTER — Other Ambulatory Visit: Payer: Self-pay | Admitting: Obstetrics & Gynecology

## 2017-02-24 ENCOUNTER — Telehealth: Payer: Self-pay | Admitting: Obstetrics and Gynecology

## 2017-02-24 NOTE — Telephone Encounter (Signed)
Elease Hashimotoatricia from [employer] is calling needing a diagnose code. Please call back # [phone] ref # [id number]

## 2017-02-25 ENCOUNTER — Ambulatory Visit (INDEPENDENT_AMBULATORY_CARE_PROVIDER_SITE_OTHER): Payer: BLUE CROSS/BLUE SHIELD | Admitting: Certified Nurse Midwife

## 2017-02-25 VITALS — BP 88/60 | Wt 139.0 lb

## 2017-02-25 DIAGNOSIS — Z3490 Encounter for supervision of normal pregnancy, unspecified, unspecified trimester: Secondary | ICD-10-CM

## 2017-02-25 DIAGNOSIS — Z3685 Encounter for antenatal screening for Streptococcus B: Secondary | ICD-10-CM

## 2017-02-25 DIAGNOSIS — Z3A36 36 weeks gestation of pregnancy: Secondary | ICD-10-CM

## 2017-02-25 NOTE — Progress Notes (Signed)
ROB  GBS/ Aptima  Pt has some braxton hicks, and cramping.

## 2017-02-25 NOTE — Progress Notes (Signed)
ROB at 36wk5d; having more cramping today, stayed home from work. Baby not as active this AM FHTs WNL. +FM noted by examiner. GBS done Cervix: FT/75%/-1 to -2/post [facility] Encouraged to eat lunch then lay down for an hour and see if she can feel 4 FM over the next hour. Labor precautions Farrel Connersolleen Erroll Wilbourne, CNM

## 2017-02-27 LAB — STREP GP B NAA: STREP GROUP B AG: NEGATIVE

## 2017-03-02 NOTE — Telephone Encounter (Signed)
Dx code added.

## 2017-03-04 ENCOUNTER — Encounter: Payer: BLUE CROSS/BLUE SHIELD | Admitting: Advanced Practice Midwife

## 2017-03-04 ENCOUNTER — Ambulatory Visit (INDEPENDENT_AMBULATORY_CARE_PROVIDER_SITE_OTHER): Payer: BLUE CROSS/BLUE SHIELD | Admitting: Obstetrics and Gynecology

## 2017-03-04 VITALS — BP 94/58 | Wt 138.0 lb

## 2017-03-04 DIAGNOSIS — Z3490 Encounter for supervision of normal pregnancy, unspecified, unspecified trimester: Secondary | ICD-10-CM

## 2017-03-04 DIAGNOSIS — O26843 Uterine size-date discrepancy, third trimester: Secondary | ICD-10-CM

## 2017-03-04 NOTE — Progress Notes (Signed)
    Routine Prenatal Care Visit  Subjective  Vickie Ruiz is a 31 y.o. G1P0 at 4255w6d being seen today for ongoing prenatal care.  She is currently monitored for the following issues for this low-risk pregnancy and has Encounter to establish care; Acne; Costochondritis; and Encounter for supervision of low-risk pregnancy, antepartum on their problem list.  ----------------------------------------------------------------------------------- Patient reports no complaints.   Contractions: Irregular. Vag. Bleeding: None.  Movement: Present. Denies leaking of fluid.     Objective   Vitals:   03/04/17 0842  BP: (!) 94/58  Weight: 138 lb (62.6 kg)   Pregravid Weight: 111 lb (50.3 kg)  Total Weight Gain: 27 lb (12.2 kg)  Urinalysis: Urine Protein: Negative Urine Glucose: 2+  Fetal Status: Fetal Heart Rate (bpm): 150  Fundal Height: 36 cm Movement: Present     General: Alert: Oriented and cooperative. Patient is in no acute distress. Skin: Skin is warm and dry. No rash noted.  Cardiovascular: Regular rate and rhythm. No murmurs, gallops, or rubs. Respiratory: Normal respiratory effort, no problems with respiration noted Abdomen: Soft, gravid, appropriate for gestational age. Pain/Pressure: Absent Pelvic: Cervical exam deferred       Extremeties: Normal range of motion.    Mental Status: Normal mood and affect. Normal behavior. Normal judgment and thought content.  Assessment   31 y.o. G1P0 at 2955w6d by  03/20/2017, by Ultrasound presenting for routine prenatal visit  Plan   pregnancy 1 Problems (from 05/26/16 to present)    Problem Noted Resolved   Encounter for supervision of low-risk pregnancy, antepartum 08/10/2016 by Nadara MustardHarris, Robert P, MD No   Overview Addendum 02/17/2017  5:15 PM by Farrel ConnersGutierrez, Colleen, CNM    Clinic [facility] Prenatal Labs  Dating 9 week u/s Blood type: A/Positive/-- (04/24 1027)   Genetic Screen AFP:   NIPS:negative XY Antibody:Negative (04/24 1027)  Anatomic  US Complete 19 weeks Rubella: 6.58 (04/24 1027) Varicella: I  GTT Early:               Third trimester:  RPR: Non Reactive (04/24 1027)   Rhogam N/A HBsAg: Negative (04/24 1027)   TDaP vaccine 01/21/17   Flu Shot: received outside of clinic HIV: Non Reactive (04/24 1027)   Baby Food       Breast                         GBS:   Contraception  Pap:07/2016 NIL  CBB     CS/VBAC    Support Person                    Size-date discrepancy and low maternal weight gain over the last few weeks. Will order ultrasound for growth and fluid level.  Term labor symptoms and general obstetric precautions including but not limited to vaginal bleeding, contractions, leaking of fluid and fetal movement were reviewed in detail with the patient.  Please refer to After Visit Summary for other counseling recommendations.   Return in about 1 week (around 03/11/2017).   Adelene Idlerhristanna Schuman MD 03/05/17 10:22 PM

## 2017-03-07 ENCOUNTER — Other Ambulatory Visit: Payer: Self-pay | Admitting: Obstetrics and Gynecology

## 2017-03-07 DIAGNOSIS — O26843 Uterine size-date discrepancy, third trimester: Secondary | ICD-10-CM

## 2017-03-09 ENCOUNTER — Encounter: Payer: Self-pay | Admitting: Maternal Newborn

## 2017-03-09 ENCOUNTER — Ambulatory Visit (INDEPENDENT_AMBULATORY_CARE_PROVIDER_SITE_OTHER): Payer: BLUE CROSS/BLUE SHIELD

## 2017-03-09 ENCOUNTER — Ambulatory Visit (INDEPENDENT_AMBULATORY_CARE_PROVIDER_SITE_OTHER): Payer: BLUE CROSS/BLUE SHIELD | Admitting: Maternal Newborn

## 2017-03-09 ENCOUNTER — Encounter: Payer: BLUE CROSS/BLUE SHIELD | Admitting: Maternal Newborn

## 2017-03-09 VITALS — BP 110/70 | Wt 140.0 lb

## 2017-03-09 DIAGNOSIS — O26843 Uterine size-date discrepancy, third trimester: Secondary | ICD-10-CM

## 2017-03-09 DIAGNOSIS — Z3490 Encounter for supervision of normal pregnancy, unspecified, unspecified trimester: Secondary | ICD-10-CM

## 2017-03-09 DIAGNOSIS — Z3A38 38 weeks gestation of pregnancy: Secondary | ICD-10-CM

## 2017-03-09 NOTE — Progress Notes (Signed)
Routine Prenatal Care Visit  Subjective  Vickie BackerJennifer Ruiz is a 31 y.o. G1P0 at 5929w3d being seen today for ongoing prenatal care.  She is currently monitored for the following issues for this low-risk pregnancy and has Encounter to establish care; Acne; Costochondritis; and Encounter for supervision of low-risk pregnancy, antepartum on her problem list.  ----------------------------------------------------------------------------------- Patient reports no complaints.   Contractions: Irregular. Vag. Bleeding: None.  Movement: Present. Denies leaking of fluid.  ----------------------------------------------------------------------------------- The following portions of the patient's history were reviewed and updated as appropriate: allergies, current medications, past family history, past medical history, past social history, past surgical history and problem list. Problem list updated.   Objective  Blood pressure 110/70, weight 140 lb (63.5 kg), last menstrual period 05/26/2016. Pregravid weight 111 lb (50.3 kg) Total Weight Gain 29 lb (13.2 kg) Urinalysis: Urine Protein: Negative Urine Glucose: 2+  Fetal Status: Fetal Heart Rate (bpm): 144 Fundal Height: 37 cm Movement: Present  Presentation: Vertex  General:  Alert, oriented and cooperative. Patient is in no acute distress.  Skin: Skin is warm and dry. No rash noted.   Cardiovascular: Normal heart rate noted  Respiratory: Normal respiratory effort, no problems with respiration noted  Abdomen: Soft, gravid, appropriate for gestational age. Pain/Pressure: Absent     Pelvic:  Cervical exam performed Dilation: Fingertip Effacement (%): 60, 70 Station: -2  Extremities: Normal range of motion.     Mental Status: Normal mood and affect. Normal behavior. Normal judgment and thought content.     Assessment   31 y.o. G1P0 at 3729w3d EDD 03/20/2017 by Ultrasound, presenting for routine prenatal visit.  Plan   pregnancy 1 Problems (from  05/26/16 to present)    Problem Noted Resolved   Encounter for supervision of low-risk pregnancy, antepartum 08/10/2016 by Nadara MustardHarris, Robert P, MD No   Overview Addendum 03/05/2017 10:22 PM by Natale MilchSchuman, Christanna R, MD    Clinic [facility] Prenatal Labs  Dating 9 week u/s Blood type: A/Positive/-- (04/24 1027)   Genetic Screen AFP:   NIPS:negative XY Antibody:Negative (04/24 1027)  Anatomic US Complete 19 weeks Rubella: 6.58 (04/24 1027) Varicella: I  GTT Early:               Third trimester:  RPR: Non Reactive (04/24 1027)   Rhogam N/A HBsAg: Negative (04/24 1027)   TDaP vaccine 01/21/17   Flu Shot: received outside of clinic HIV: Non Reactive (04/24 1027)   Baby Food       Breast                         GBS: Negative  Contraception  Pap:07/2016 NIL  CBB     CS/VBAC    Support Person                  Ultrasound today shows baby at 83% tile for growth and AC at 90% tile. Fetal cardiac arrhythmia intermittently present, d/w patient that pediatricians will likely evaluate if it persists after birth. Consulted with MD and recommendation is for NST at remaining prenatal visits.     Discussed continuing vitamin supplements postpartum for lactation support, and that we will decide whether Fe supplementation is still needed based on labs and symptoms after birth.  Term labor symptoms and general obstetric precautions including but not limited to vaginal bleeding, contractions, leaking of fluid and fetal movement were reviewed in detail with the patient.  Return in about 1 week (around 03/16/2017) for ROB with  NST.  Marcelyn BruinsJacelyn Schmid, CNM 03/09/2017  1:57 PM

## 2017-03-09 NOTE — Progress Notes (Signed)
C/o wondering about supplementation after baby arrives. rj

## 2017-03-13 ENCOUNTER — Other Ambulatory Visit: Payer: Self-pay

## 2017-03-13 ENCOUNTER — Inpatient Hospital Stay
Admission: EM | Admit: 2017-03-13 | Discharge: 2017-03-16 | DRG: 807 | Disposition: A | Discharge status: Home / Self Care | Payer: BLUE CROSS/BLUE SHIELD | Attending: Obstetrics & Gynecology | Admitting: Obstetrics & Gynecology

## 2017-03-13 ENCOUNTER — Inpatient Hospital Stay: Payer: BLUE CROSS/BLUE SHIELD | Admitting: Anesthesiology

## 2017-03-13 DIAGNOSIS — Z3A39 39 weeks gestation of pregnancy: Secondary | ICD-10-CM | POA: Diagnosis not present

## 2017-03-13 DIAGNOSIS — Z3483 Encounter for supervision of other normal pregnancy, third trimester: Secondary | ICD-10-CM | POA: Diagnosis present

## 2017-03-13 LAB — TYPE AND SCREEN
ABO/RH(D): A POS
ANTIBODY SCREEN: NEGATIVE

## 2017-03-13 LAB — CBC
HEMATOCRIT: 39.5 % (ref 35.0–47.0)
Hemoglobin: 13.7 g/dL (ref 12.0–16.0)
MCH: 33.6 pg (ref 26.0–34.0)
MCHC: 34.7 g/dL (ref 32.0–36.0)
MCV: 96.9 fL (ref 80.0–100.0)
PLATELETS: 115 10*3/uL — AB (ref 150–440)
RBC: 4.07 MIL/uL (ref 3.80–5.20)
RDW: 13.8 % (ref 11.5–14.5)
WBC: 12.8 10*3/uL — ABNORMAL HIGH (ref 3.6–11.0)

## 2017-03-13 MED ORDER — SOD CITRATE-CITRIC ACID 500-334 MG/5ML PO SOLN: 30.0000 mL | ORAL | Status: DC | PRN

## 2017-03-13 MED ORDER — ONDANSETRON HCL 4 MG/2ML IJ SOLN: 4.0000 mg | Freq: Four times a day (QID) | INTRAMUSCULAR | Status: DC | PRN

## 2017-03-13 MED ORDER — FENTANYL 2.5 MCG/ML W/ROPIVACAINE 0.15% IN NS 100 ML EPIDURAL (ARMC)
12.0000 mL/h | EPIDURAL | Status: DC
  Administered 2017-03-13: 12 mL/h via EPIDURAL

## 2017-03-13 MED ORDER — MISOPROSTOL 200 MCG PO TABS
ORAL_TABLET | ORAL | Status: AC
  Filled 2017-03-13: qty 4

## 2017-03-13 MED ORDER — FENTANYL 2.5 MCG/ML W/ROPIVACAINE 0.15% IN NS 100 ML EPIDURAL (ARMC)
EPIDURAL | Status: AC
  Filled 2017-03-13: qty 100

## 2017-03-13 MED ORDER — EPHEDRINE 5 MG/ML INJ
10.0000 mg | INTRAVENOUS | Status: DC | PRN
  Filled 2017-03-13: qty 2

## 2017-03-13 MED ORDER — LIDOCAINE-EPINEPHRINE (PF) 1.5 %-1:200000 IJ SOLN
INTRAMUSCULAR | Status: DC | PRN
  Administered 2017-03-13: 3 mL via EPIDURAL

## 2017-03-13 MED ORDER — PHENYLEPHRINE 40 MCG/ML (10ML) SYRINGE FOR IV PUSH (FOR BLOOD PRESSURE SUPPORT)
80.0000 ug | PREFILLED_SYRINGE | INTRAVENOUS | Status: DC | PRN
  Filled 2017-03-13: qty 5

## 2017-03-13 MED ORDER — LACTATED RINGERS IV SOLN: 500.0000 mL | Freq: Once | INTRAVENOUS | Status: DC

## 2017-03-13 MED ORDER — DIPHENHYDRAMINE HCL 50 MG/ML IJ SOLN: 12.5000 mg | INTRAMUSCULAR | Status: DC | PRN

## 2017-03-13 MED ORDER — OXYTOCIN 40 UNITS IN LACTATED RINGERS INFUSION - SIMPLE MED
2.5000 [IU]/h | INTRAVENOUS | Status: DC
  Filled 2017-03-13: qty 1000

## 2017-03-13 MED ORDER — BUPIVACAINE HCL (PF) 0.25 % IJ SOLN
INTRAMUSCULAR | Status: DC | PRN
  Administered 2017-03-13 (×2): 5 mL via EPIDURAL

## 2017-03-13 MED ORDER — ACETAMINOPHEN 325 MG PO TABS: 650.0000 mg | ORAL_TABLET | ORAL | Status: DC | PRN

## 2017-03-13 MED ORDER — OXYTOCIN BOLUS FROM INFUSION: 500.0000 mL | Freq: Once | INTRAVENOUS | Status: DC

## 2017-03-13 MED ORDER — OXYTOCIN 10 UNIT/ML IJ SOLN
INTRAMUSCULAR | Status: AC
  Filled 2017-03-13: qty 2

## 2017-03-13 MED ORDER — LIDOCAINE HCL (PF) 1 % IJ SOLN
30.0000 mL | INTRAMUSCULAR | Status: AC | PRN
  Administered 2017-03-13 (×3): 1 mL via SUBCUTANEOUS

## 2017-03-13 MED ORDER — AMMONIA AROMATIC IN INHA
RESPIRATORY_TRACT | Status: AC
  Filled 2017-03-13: qty 10

## 2017-03-13 MED ORDER — LACTATED RINGERS IV SOLN: 500.0000 mL | INTRAVENOUS | Status: DC | PRN

## 2017-03-13 MED ORDER — EPHEDRINE 5 MG/ML INJ
INTRAVENOUS | Status: AC
  Filled 2017-03-13: qty 4

## 2017-03-13 MED ORDER — LACTATED RINGERS IV SOLN
INTRAVENOUS | Status: DC
  Administered 2017-03-13 – 2017-03-14 (×2): via INTRAVENOUS

## 2017-03-13 NOTE — Progress Notes (Signed)
Pt requests epidural 2140 Dr Orson ApePiscatello [facility] dose given  Maternal HR 68 2210 Bolus does #1 2213 Bolus dose #2 2213 Drip Started

## 2017-03-13 NOTE — H&P (Signed)
Obstetrics Admission History & Physical   CC: contractions  HPI:  31 y.o. G1P0 @ 5328w0d (03/20/2017, by Ultrasound). Admitted on 03/13/2017:   Patient Active Problem List   Diagnosis Date Noted  . Labor and delivery, indication for care 03/13/2017  . Encounter for supervision of low-risk pregnancy, antepartum 08/10/2016  . Encounter to establish care 06/16/2015  . Acne 06/16/2015  . Costochondritis 06/16/2015     Presents for painful ctxs and sign of labor at term; no ROM or VB, good FM.   [facility]. Pregnancy complicated by none.  ROS: A review of systems was performed and negative, except as stated in the above HPI. PMHx:  Past Medical History:  Diagnosis Date  . Arthritis   . Costochondritis    PSHx:  Past Surgical History:  Procedure Laterality Date  . WISDOM TOOTH EXTRACTION  2011   Medications:  Medications Prior to Admission  Medication Sig Dispense Refill Last Dose  . ferrous sulfate 325 (65 FE) MG tablet Take 325 mg daily with breakfast by mouth.   03/13/2017 at Unknown time  . Prenatal Vit-Fe Fumarate-FA (PRENATAL VITAMIN PO) Take by mouth.   03/13/2017 at Unknown time   Allergies: has No Known Allergies. OBHx:  OB History  Gravida Para Term Preterm AB Living  1            SAB TAB Ectopic Multiple Live Births               # Outcome Date GA Lbr Len/2nd Weight Sex Delivery Anes PTL Lv  1 Current              ZOX:[id number]:Negative/unremarkable except as detailed in HPI.Marland Kitchen.  No family history of birth defects. Soc Hx: Never smoker, Alcohol: none and Recreational drug use: none  Objective:   Vitals:   03/13/17 2211 03/13/17 2213  BP: 116/60 117/61  Pulse: 70 66  Resp:    Temp:    SpO2:     Constitutional: Well nourished, well developed female in no acute distress.  HEENT: normal Skin: Warm and dry.  Cardiovascular:Regular rate and rhythm.   Extremity: trace to 1+ bilateral pedal edema Respiratory: Clear to auscultation bilateral. Normal  respiratory effort Abdomen: mild Back: no CVAT Neuro: DTRs 2+, Cranial nerves grossly intact Psych: Alert and Oriented x3. No memory deficits. Normal mood and affect.  MS: normal gait, normal bilateral lower extremity ROM/strength/stability.  Pelvic exam: is not limited by body habitus EGBUS: within normal limits Vagina: within normal limits and with normal mucosa blood in the vault Cervix: 4/80/-2 on admission Uterus: Spontaneous uterine activity  Adnexa: not evaluated  EFM:FHR: 130 bpm, variability: moderate,  accelerations:  Present,  decelerations:  Absent Toco: Frequency: Every 2-3 minutes   Perinatal info:  Blood type: A positive Rubella- Immune Varicella -Immune TDaP Given during third trimester of this pregnancy RPR NR / HIV Neg/ HBsAg Neg   Assessment & Plan:   31 y.o. G1P0 @ 3828w0d, Admitted on 03/13/2017:early active labor Admit for labor, Observe for cervical change, Fetal Wellbeing Reassuring, Epidural when ready and AROM when Appropriate   Epidural done, tolerated well AROM now; pt is now 7 cm;  FSE placed due to decel at this time, likely due to changes related to epidural.  O2 and positioning help.  Anticipate second stage soon  Annamarie MajorPaul Harris, MD, Merlinda FrederickFACOG [facility], [facility] 03/13/2017  10:44 PM

## 2017-03-13 NOTE — Anesthesia Preprocedure Evaluation (Signed)
Anesthesia Evaluation  Patient identified by MRN, date of birth, ID band Patient awake    Reviewed: Allergy & Precautions, H&P , NPO status , Patient's Chart, lab work & pertinent test results  History of Anesthesia Complications Negative for: history of anesthetic complications  Airway Mallampati: III  TM Distance: >3 FB Neck ROM: full    Dental  (+) Chipped   Pulmonary neg pulmonary ROS,           Cardiovascular Exercise Tolerance: Good (-) hypertensionnegative cardio ROS       Neuro/Psych    GI/Hepatic negative GI ROS,   Endo/Other    Renal/GU   negative genitourinary   Musculoskeletal  (+) Arthritis ,   Abdominal   Peds  Hematology negative hematology ROS (+)   Anesthesia Other Findings Past Medical History: No date: Arthritis No date: Costochondritis  Past Surgical History: 2011: WISDOM TOOTH EXTRACTION  BMI    Body Mass Index:  25.61 kg/m      Reproductive/Obstetrics (+) Pregnancy                             Anesthesia Physical Anesthesia Plan  ASA: II  Anesthesia Plan: Epidural   Post-op Pain Management:    Induction:   PONV Risk Score and Plan:   Airway Management Planned:   Additional Equipment:   Intra-op Plan:   Post-operative Plan:   Informed Consent: I have reviewed the patients History and Physical, chart, labs and discussed the procedure including the risks, benefits and alternatives for the proposed anesthesia with the patient or authorized representative who has indicated his/her understanding and acceptance.     Plan Discussed with: Anesthesiologist  Anesthesia Plan Comments:         Anesthesia Quick Evaluation

## 2017-03-13 NOTE — Anesthesia Procedure Notes (Signed)
Epidural Patient location during procedure: OB Start time: 03/13/2017 9:57 PM End time: 03/13/2017 10:03 PM  Staffing Anesthesiologist: Piscitello, Cleda MccreedyJoseph K, MD Performed: anesthesiologist   Preanesthetic Checklist Completed: patient identified, site marked, surgical consent, pre-op evaluation, timeout performed, IV checked, risks and benefits discussed and monitors and equipment checked  Epidural Patient position: sitting Prep: Betadine Patient monitoring: heart rate, continuous pulse ox and blood pressure Approach: midline Location: L3-L4 Injection technique: LOR saline  Needle:  Needle type: Tuohy  Needle gauge: 17 G Needle length: 9 cm and 9 Needle insertion depth: 4.5 cm Catheter type: closed end flexible Catheter size: 19 Gauge Catheter at skin depth: 9.5 cm Test dose: negative and 1.5% lidocaine with Epi 1:200 K  Assessment Sensory level: T10 Events: blood not aspirated, injection not painful, no injection resistance, negative IV test and no paresthesia  Additional Notes 3 attempts.  Patient very tender with local placement  Pt. Evaluated and documentation done after procedure finished. Patient identified. Risks/Benefits/Options discussed with patient including but not limited to bleeding, infection, nerve damage, paralysis, failed block, incomplete pain control, headache, blood pressure changes, nausea, vomiting, reactions to medication both or allergic, itching and postpartum back pain. Confirmed with bedside nurse the patient's most recent platelet count. Confirmed with patient that they are not currently taking any anticoagulation, have any bleeding history or any family history of bleeding disorders. Patient expressed understanding and wished to proceed. All questions were answered. Sterile technique was used throughout the entire procedure. Please see nursing notes for vital signs. Test dose was given through epidural catheter and negative prior to continuing to  dose epidural or start infusion. Warning signs of high block given to the patient including shortness of breath, tingling/numbness in hands, complete motor block, or any concerning symptoms with instructions to call for help. Patient was given instructions on fall risk and not to get out of bed. All questions and concerns addressed with instructions to call with any issues or inadequate analgesia.   Patient tolerated the insertion well without immediate complications.Reason for block:procedure for pain

## 2017-03-13 NOTE — Discharge Instructions (Signed)

## 2017-03-13 NOTE — OB Triage Note (Signed)
Pt presents to L&D with c/o contractions since 12:30 pm q5-6 min. Reports good fetal movement and denies vaginal bleeding or Leaking of fluid. EFM and toco applied and explained. Plan to monitor fertal and maternal well being and assess for labor.

## 2017-03-14 DIAGNOSIS — Z3A39 39 weeks gestation of pregnancy: Secondary | ICD-10-CM

## 2017-03-14 MED ORDER — SENNOSIDES-DOCUSATE SODIUM 8.6-50 MG PO TABS
2.0000 | ORAL_TABLET | ORAL | Status: DC
  Administered 2017-03-15 – 2017-03-16 (×2): 2 via ORAL
  Filled 2017-03-14 (×2): qty 2

## 2017-03-14 MED ORDER — DIBUCAINE 1 % RE OINT: 1.0000 "application " | TOPICAL_OINTMENT | RECTAL | Status: DC | PRN

## 2017-03-14 MED ORDER — ACETAMINOPHEN 325 MG PO TABS: 650.0000 mg | ORAL_TABLET | ORAL | Status: DC | PRN

## 2017-03-14 MED ORDER — DIPHENHYDRAMINE HCL 25 MG PO CAPS: 25.0000 mg | ORAL_CAPSULE | Freq: Four times a day (QID) | ORAL | Status: DC | PRN

## 2017-03-14 MED ORDER — ONDANSETRON HCL 4 MG/2ML IJ SOLN: 4.0000 mg | INTRAMUSCULAR | Status: DC | PRN

## 2017-03-14 MED ORDER — IBUPROFEN 600 MG PO TABS
600.0000 mg | ORAL_TABLET | Freq: Four times a day (QID) | ORAL | Status: DC
  Administered 2017-03-14 – 2017-03-16 (×10): 600 mg via ORAL
  Filled 2017-03-14 (×9): qty 1

## 2017-03-14 MED ORDER — COCONUT OIL OIL
1.0000 "application " | TOPICAL_OIL | Status: DC | PRN
  Filled 2017-03-14: qty 120

## 2017-03-14 MED ORDER — OXYCODONE-ACETAMINOPHEN 5-325 MG PO TABS: 2.0000 | ORAL_TABLET | ORAL | Status: DC | PRN

## 2017-03-14 MED ORDER — ONDANSETRON HCL 4 MG PO TABS: 4.0000 mg | ORAL_TABLET | ORAL | Status: DC | PRN

## 2017-03-14 MED ORDER — SODIUM CHLORIDE 0.9 % IV SOLN: 250.0000 mL | INTRAVENOUS | Status: DC | PRN

## 2017-03-14 MED ORDER — SODIUM CHLORIDE 0.9% FLUSH: 3.0000 mL | Freq: Two times a day (BID) | INTRAVENOUS | Status: DC

## 2017-03-14 MED ORDER — ZOLPIDEM TARTRATE 5 MG PO TABS: 5.0000 mg | ORAL_TABLET | Freq: Every evening | ORAL | Status: DC | PRN

## 2017-03-14 MED ORDER — SODIUM CHLORIDE 0.9% FLUSH: 3.0000 mL | INTRAVENOUS | Status: DC | PRN

## 2017-03-14 MED ORDER — SIMETHICONE 80 MG PO CHEW: 80.0000 mg | CHEWABLE_TABLET | ORAL | Status: DC | PRN

## 2017-03-14 MED ORDER — WITCH HAZEL-GLYCERIN EX PADS: 1.0000 "application " | MEDICATED_PAD | CUTANEOUS | Status: DC | PRN

## 2017-03-14 MED ORDER — BENZOCAINE-MENTHOL 20-0.5 % EX AERO: 1.0000 "application " | INHALATION_SPRAY | CUTANEOUS | Status: DC | PRN

## 2017-03-14 MED ORDER — OXYCODONE-ACETAMINOPHEN 5-325 MG PO TABS
1.0000 | ORAL_TABLET | ORAL | Status: DC | PRN
  Administered 2017-03-14: 1 via ORAL
  Filled 2017-03-14 (×2): qty 1

## 2017-03-14 NOTE — Progress Notes (Signed)
  Labor Progress Note   31 y.o. G1P0 @ 3542w1d , admitted for  Pregnancy, Labor Management.   Subjective:  Min pain w epidural  Objective:  BP (!) 99/51   Pulse (!) 190   Temp 98.3 F (36.8 C) (Oral)   Resp 18   Ht 5\' 2"  (1.575 m)   Wt 140 lb (63.5 kg)   LMP 05/26/2016 Comment: neg preg test 12/01/15  SpO2 100%   BMI 25.61 kg/m  Abd: gravid, NT Extr: trace to 1+ bilateral pedal edema SVE: now 9+/100/+1  EFM: FHR: 140 bpm, variability: moderate,  accelerations:  Present,  decelerations:  Present var decels w recovery; recent one for 2 min w recovery using O2 and positioning Toco: Frequency: Every 2-4 minutes Labs: I have reviewed the patient's lab results.   Assessment & Plan:  G1P0 @ 3442w1d, admitted for  Pregnancy and Labor/Delivery Management  1. Pain management: epidural. 2. FWB: FHT category 1.  3. ID: GBS negative 4. Labor management: Cont exp mgt. as making progress and anticipate second stage soon.   All discussed with patient, see orders  Annamarie MajorPaul Harris, MD, Merlinda FrederickFACOG [facility], [facility] 03/14/2017  2:04 AM

## 2017-03-14 NOTE — Progress Notes (Signed)
Post Partum Day 0 (5 hours pp) Subjective: tolerating PO and breast feeding. OOB to BR x1 without lightheadedness. Tired/ sore perineum  Objective: Blood pressure (!) 105/59, pulse 69, temperature 98.5 F (36.9 C), temperature source Oral, resp. rate 18, height 5\' 2"  (1.575 m), weight 63.5 kg (140 lb), last menstrual period 05/26/2016, SpO2 99 %, unknown if currently breastfeeding.  Physical Exam:  General: alert, cooperative and no distress/ smiling Lochia:moderate Uterine Fundus: firm and U to U+1/ deviated to left slightly after voiding large amount (was at U+4)  DVT Evaluation: No evidence of DVT seen on physical exam.  Recent Labs    03/13/17 2108  HGB 13.7  HCT 39.5  WBC 12.8*  PLT 115*    Assessment/Plan: stable DOD Continue postpartum care Advised to void every 3-4 hours Breast feeding A POS/RI/ VI TDAP and flu vaccine UTD Possible discharge tomorrow.   LOS: 1 day   Farrel ConnersColleen Catalaya Garr 03/14/2017, 8:36 AM

## 2017-03-14 NOTE — Discharge Summary (Signed)
OB Discharge Summary     Patient Name: Vickie BackerJennifer Ruiz DOB: [birth year] MRN: [id number]  Date of admission: 03/13/2017 Delivering MD: Letitia Libraobert Paul Harris, MD  Date of Delivery: 03/13/2017  Date of discharge: 03/16/2017  Admitting diagnosis: 40 wks pregnancy contractions Intrauterine pregnancy: 8780w1d     Secondary diagnosis: None     Discharge diagnosis: Term Pregnancy Delivered, No other diagnosis                         Hospital course:  Onset of Labor With Vaginal Delivery     31 y.o. yo G1P1001 at 1580w1d was admitted in Active Labor on 03/13/2017. Patient had an uncomplicated labor course as follows:  Membrane Rupture Time/Date: 10:34 PM ,03/13/2017   Intrapartum Procedures: Episiotomy:                                           Lacerations:  2nd degree [3]  Patient had a delivery of a Viable infant. 03/14/2017  Information for the patient's newborn:  Arnetha Massyrdmann, Boy Ryah [[id number]][[id number]]  Delivery Method: Vag-Spont    Pateint had an uncomplicated postpartum course.  She is ambulating, tolerating a regular diet, passing flatus, and urinating well. Patient is discharged home in stable condition on 03/16/17.                                                                  Post partum procedures:none  Complications: None  Physical exam on 03/16/2017: Vitals:   03/15/17 0736 03/15/17 1646 03/16/17 0045 03/16/17 0843  BP: 107/66 100/69 121/70 (!) 111/51  Pulse: 72  66 64  Resp: 18 18 16 18   Temp: 97.7 F (36.5 C) 98.6 F (37 C) 97.6 F (36.4 C) 97.9 F (36.6 C)  TempSrc: Oral Oral Oral Oral  SpO2: 99%  99% 99%  Weight:      Height:       General: alert, cooperative and no distress Lochia: appropriate Uterine Fundus: firm Incision: N/A DVT Evaluation: No evidence of DVT seen on physical exam. No cords or calf tenderness. No significant calf/ankle edema.  Labs: Lab Results  Component Value Date   WBC 12.5 (H) 03/15/2017   HGB 11.3 (L) 03/15/2017   HCT 33.1 (L)  03/15/2017   MCV 98.8 03/15/2017   PLT 115 (L) 03/15/2017   CMP Latest Ref Rng & Units 12/01/2015  Glucose 65 - 99 mg/dL 83  BUN 6 - 20 mg/dL 9  Creatinine 9.520.44 - 8.411.00 mg/dL 3.240.77  Sodium 401135 - 027145 mmol/L 133(L)  Potassium 3.5 - 5.1 mmol/L 4.0  Chloride 101 - 111 mmol/L 99(L)  CO2 22 - 32 mmol/L 27  Calcium 8.9 - 10.3 mg/dL 9.6  Total Protein 6.5 - 8.1 g/dL 7.3  Total Bilirubin 0.3 - 1.2 mg/dL 0.6  Alkaline Phos 38 - 126 U/L 49  AST 15 - 41 U/L 29  ALT 14 - 54 U/L 29    Discharge instruction: per After Visit Summary.  Medications:  Allergies as of 03/16/2017   No Known Allergies     Medication List    STOP taking these medications   ferrous sulfate 325 (  65 FE) MG tablet     TAKE these medications   ibuprofen 600 MG tablet Commonly known as:  ADVIL,MOTRIN Take 1 tablet (600 mg total) by mouth every 6 (six) hours.   norethindrone 0.35 MG tablet Commonly known as:  MICRONOR,CAMILA,ERRIN Take 1 tablet (0.35 mg total) by mouth daily.   PRENATAL VITAMIN PO Take by mouth.       Diet: routine diet  Activity: Advance as tolerated. Pelvic rest for 6 weeks.   Outpatient follow up: Follow-up Information    Nadara MustardHarris, Robert P, MD. Schedule an appointment as soon as possible for a visit in 6 week(s).   Specialty:  Obstetrics and Gynecology Why:  for Post Partum check Contact information: [address] [place] [address] [phone]             Postpartum contraception: Progesterone only pills Rhogam Given postpartum: no Rubella vaccine given postpartum: no Varicella vaccine given postpartum: no TDaP given antepartum or postpartum: Yes  Newborn Data: Live born female  Birth Weight:   APGAR: 9, 9  Newborn Delivery   Birth date/time:  03/14/2017 03:52:00 Delivery type:  Vaginal, Spontaneous      Baby Feeding: Breast  Disposition:home with mother  SIGNED: Thomasene MohairStephen Abeera Flannery, MD 03/16/2017 11:37 AM

## 2017-03-15 LAB — CBC
HEMATOCRIT: 33.1 % — AB (ref 35.0–47.0)
Hemoglobin: 11.3 g/dL — ABNORMAL LOW (ref 12.0–16.0)
MCH: 33.8 pg (ref 26.0–34.0)
MCHC: 34.2 g/dL (ref 32.0–36.0)
MCV: 98.8 fL (ref 80.0–100.0)
Platelets: 115 10*3/uL — ABNORMAL LOW (ref 150–440)
RBC: 3.36 MIL/uL — ABNORMAL LOW (ref 3.80–5.20)
RDW: 14.1 % (ref 11.5–14.5)
WBC: 12.5 10*3/uL — ABNORMAL HIGH (ref 3.6–11.0)

## 2017-03-15 LAB — RPR: RPR: NONREACTIVE

## 2017-03-15 NOTE — Plan of Care (Signed)
Patient stable and within normal limits. Provided education on caring for breast tenderness and feedings.Will continue to monitor throughout shift.

## 2017-03-15 NOTE — Progress Notes (Signed)
Post Partum Day 1 Subjective: Doing well, no complaints.  Tolerating regular diet, pain with PO meds, voiding and ambulating without difficulty. Postpartum care/lactation instructions reviewed.   No CP SOB F/C N/V or leg pain No HA, change of vision, RUQ/epigastric pain  Objective: BP 107/66 (BP Location: Right Arm)   Pulse 72   Temp 97.7 F (36.5 C) (Oral)   Resp 18   Ht 5\' 2"  (1.575 m)   Wt 140 lb (63.5 kg)   LMP 05/26/2016 Comment: neg preg test 12/01/15  SpO2 99%   Breastfeeding  BMI 25.61 kg/m    Physical Exam:  General: NAD CV: RRR Pulm: nl effort, CTABL Lochia: moderate Uterine Fundus: fundus firm and below umbilicus DVT Evaluation: no cords, ttp LEs   Recent Labs    03/13/17 2108 03/15/17 0548  HGB 13.7 11.3*  HCT 39.5 33.1*  WBC 12.8* 12.5*  PLT 115* 115*    Assessment/Plan: 31 y.o. G1P1001 postpartum day # 1  1. Continue routine postpartum care 2. A positive, Rubella Immune, Varicella Immune 3. TDAP: UTD 4. Breast/Contraception: progesterone-only pill 5. Disposition: discharge to home tomorrow    Tresea MallJane Jailin Manocchio, CNM

## 2017-03-15 NOTE — Lactation Note (Signed)
This note was copied from a baby's chart. Lactation Consultation Note  Patient Name: Vickie Ciro BackerJennifer Hollenkamp ZOXWR'UToday's Date: 03/15/2017 Reason for consult: Follow-up assessment  Mom c/o sore nipples. Bruises below each areola from missed latches. Nipples pink. I have worked with this family a few times today to adjust position and latch. Mom says each time the latch feels better. Baby does have a tight tongue frenulum in that the tip of tongue is notched, especially when extended; barely goes past gumline; decreased lateral movement, but can suck fairly well on finger during exam with ability to "cup" finger. Lip frenulum is also tight in that it is difficult to keep lips flanged during nursing. Despite four feedings where we worked diligently on position and latch, mom's nipple was compressed each time. We did hear audible swallows, but only when he was skin to skin and parents stimulated baby while mom compressed breasts to help with flow. His diaper count is WNL right now. If latch does not improve and weight, jaundice or baby comfort becomes a problem, it may be helpful for this family to have a specialist assess for Ankyloglossia and potential treatment. Too soon to tell right now.   Maternal Data    Feeding Length of feed: 30 min  LATCH Score Latch: Grasps breast easily, tongue down, lips flanged, rhythmical sucking.  Audible Swallowing: A few with stimulation  Type of Nipple: Everted at rest and after stimulation  Comfort (Breast/Nipple): Filling, red/small blisters or bruises, mild/mod discomfort  Hold (Positioning): No assistance needed to correctly position infant at breast.  LATCH Score: 8  Interventions Interventions: (reminders and encouragement to feed per cues. )  Lactation Tools Discussed/Used     Consult Status      Sunday CornSandra Clark Emine Lopata 03/15/2017, 4:31 PM

## 2017-03-15 NOTE — Anesthesia Postprocedure Evaluation (Signed)
Anesthesia Post Note  Patient: Ciro BackerJennifer Eichelberger  Procedure(s) Performed: AN AD HOC LABOR EPIDURAL  Patient location during evaluation: Mother Baby Anesthesia Type: Epidural Level of consciousness: awake, awake and alert and oriented Pain management: pain level controlled Vital Signs Assessment: post-procedure vital signs reviewed and stable Respiratory status: spontaneous breathing, nonlabored ventilation and respiratory function stable Cardiovascular status: stable Postop Assessment: no headache and no backache Anesthetic complications: no     Last Vitals:  Vitals:   03/14/17 1952 03/14/17 2310  BP: 111/70 115/75  Pulse: 67 65  Resp: 20 16  Temp: 36.7 C 36.5 C  SpO2: 98% 100%    Last Pain:  Vitals:   03/15/17 0040  TempSrc:   PainSc: Asleep                 Mehek Grega,  Sheran FavaMark R

## 2017-03-16 MED ORDER — NORETHINDRONE 0.35 MG PO TABS: 1.0000 | ORAL_TABLET | Freq: Every day | ORAL | 11 refills | Status: DC

## 2017-03-16 MED ORDER — IBUPROFEN 600 MG PO TABS: 600.0000 mg | ORAL_TABLET | Freq: Four times a day (QID) | ORAL | 0 refills | Status: DC

## 2017-03-16 NOTE — Lactation Note (Signed)
This note was copied from a baby's chart. Lactation Consultation Note  Patient Name: Vickie Ciro BackerJennifer Nunes AVWUJ'WToday's Date: 03/16/2017    Mom reports that her breasts are fuller today and Baby Fayrene FearingJames is getting more content now after feeds. They still c/o baby being gassy. I taught them burping techniques and reminded them about the importance of a good seal of his lips around areola to reduce air intake and improve milk intake. He is still compressing her left nipple a lot , but Mom says he does better on other side. I gave her Shells for sore nipples to use after comfort gels. I reviewed signs of well fed baby and proper breastfeeding and how to get help if any problems.   Since I am not seeing a real improvement in latch since yesterday despite best efforts I did give Mom handouts on Tongue and lip ties for her to review and share with MD to discuss. I would say that if nipples trauma increases, weight issues, breast issues, etc despite best efforts, that proper assessment to diagnose and treat ties would be next best step.   Mom has manual hand pump and taught hand expression. She has info on obtaining DEBP from insurance company. She has LC and [facility] [employer] contact info.    Maternal Data    Feeding Feeding Type: Breast Fed  LATCH Score Latch: Grasps breast easily, tongue down, lips flanged, rhythmical sucking.  Audible Swallowing: A few with stimulation  Type of Nipple: Everted at rest and after stimulation  Comfort (Breast/Nipple): Filling, red/small blisters or bruises, mild/mod discomfort  Hold (Positioning): No assistance needed to correctly position infant at breast.  LATCH Score: 8  Interventions    Lactation Tools Discussed/Used     Consult Status      Sunday CornSandra Clark Alivya Wegman 03/16/2017, 12:49 PM

## 2017-03-17 ENCOUNTER — Encounter: Payer: BLUE CROSS/BLUE SHIELD | Admitting: Certified Nurse Midwife

## 2017-03-20 ENCOUNTER — Inpatient Hospital Stay: Admission: RE | Admit: 2017-03-20 | Payer: BLUE CROSS/BLUE SHIELD | Source: Ambulatory Visit | Admitting: *Deleted

## 2017-03-22 ENCOUNTER — Telehealth: Payer: Self-pay

## 2017-03-22 NOTE — Telephone Encounter (Signed)
Pt delivered 03/14/17 states that she feels like her bleeding is getting slightly heavier since she has left the hospital. Pt denies fever, abd pain and changing pad every 1.5-2 hours. Pt does feel like clotting and heavier flow occurs after breastfeeding. Pt denies frequent headaches or dizziness. HGB at discharge 11.3. Advised patient blood flow patterns can vary but to contact us if sxs worsen or persist.

## 2017-04-06 ENCOUNTER — Telehealth: Payer: Self-pay

## 2017-04-06 NOTE — Telephone Encounter (Signed)
FMLA/DISABILITY form for [employer] filled out, signature obtained, and given to [place] for processing.  

## 2017-04-26 ENCOUNTER — Encounter: Payer: Self-pay | Admitting: Obstetrics & Gynecology

## 2017-04-26 ENCOUNTER — Ambulatory Visit (INDEPENDENT_AMBULATORY_CARE_PROVIDER_SITE_OTHER): Payer: BLUE CROSS/BLUE SHIELD | Admitting: Obstetrics & Gynecology

## 2017-04-26 NOTE — Progress Notes (Signed)
  OBSTETRICS POSTPARTUM CLINIC PROGRESS NOTE  Subjective:     Vickie Ruiz is a 32 y.o. 441P1001 female who presents for a postpartum visit. She is 6 weeks postpartum following a Term pregnancy and delivery by Vaginal, no problems at delivery.  I have fully reviewed the prenatal and intrapartum course. Anesthesia: epidural.  Postpartum course has been complicated by uncomplicated.  Baby is feeding by Breast.  Bleeding: patient has not  resumed menses.  Bowel function is normal. Bladder function is normal.  Patient is not sexually active. Contraception method desired is oral progesterone-only contraceptive.  Postpartum depression screening: negative. Edinburgh 6  The following portions of the patient's history were reviewed and updated as appropriate: allergies, current medications, past family history, past medical history, past social history, past surgical history and problem list.  Review of Systems Pertinent items are noted in HPI.  Objective:    BP 100/60   Pulse (!) 57   Ht 5\' 2"  (1.575 m)   Wt 122 lb (55.3 kg)   LMP 05/26/2016 Comment: neg preg test 12/01/15  BMI 22.31 kg/m   General:  alert and no distress   Breasts:  inspection negative, no nipple discharge or bleeding, no masses or nodularity palpable  Lungs: clear to auscultation bilaterally  Heart:  regular rate and rhythm, S1, S2 normal, no murmur, click, rub or gallop  Abdomen: soft, non-tender; bowel sounds normal; no masses,  no organomegaly.     Vulva:  normal  Vagina: normal vagina, no discharge, exudate, lesion, or erythema  Cervix:  no cervical motion tenderness and no lesions  Corpus: normal size, contour, position, consistency, mobility, non-tender  Adnexa:  normal adnexa and no mass, fullness, tenderness  Rectal Exam: Not performed.        Assessment:  Post Partum Care visit 1. Postpartum care and examination  Plan:  See orders and Patient Instructions Contraceptive counseling for oral  progesterone-only contraceptive, considering IUD in future Follow up in: 4 months for Annual PAP or as needed.   Annamarie MajorPaul Kyran Whittier, MD, Merlinda FrederickFACOG [facility], [facility] 04/26/2017  2:24 PM

## 2017-06-18 IMAGING — CT CT ABD-PELV W/ CM
2 of 4 series · 16 of 46 positions shown, 18 images · IV contrast (APPLIED)
Comparison: None.

CLINICAL DATA: Acute onset of lower abdominal pain. Initial
encounter.

EXAM:
CT ABDOMEN AND PELVIS WITH CONTRAST
TECHNIQUE: Multidetector CT imaging of the abdomen and pelvis was performed
using the standard protocol following bolus administration of
intravenous contrast.
CONTRAST:  100mL [id number] IOPAMIDOL ([id number]) INJECTION 61%

[Series 2: axial st · axial · 0.76mm/px · z∈[-296,+98]mm · 13 of 87 slices shown, 15 images]
[im 4/87  soft-tissue]
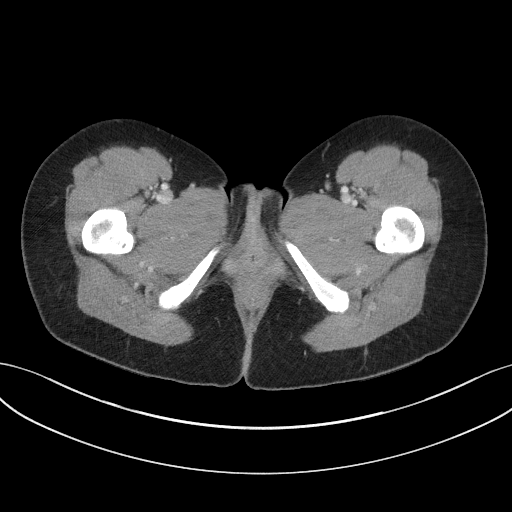
[im 4/87  bone]
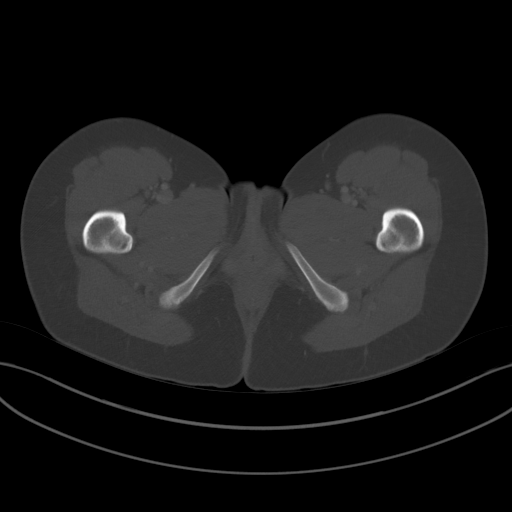
[im 12/87  soft-tissue]
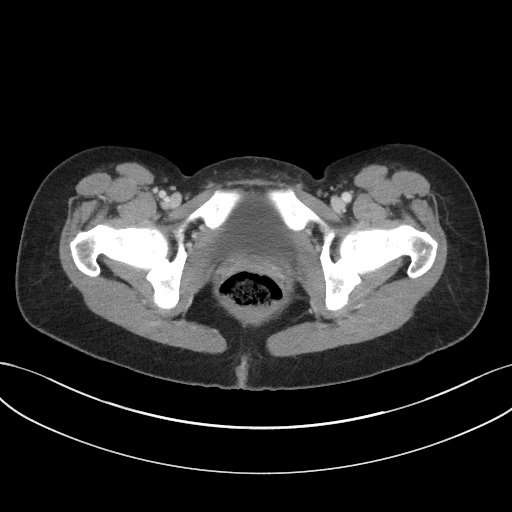
[im 19/87  soft-tissue]
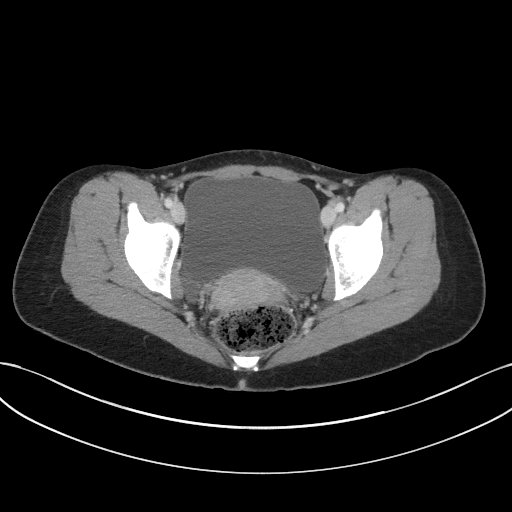
[im 23/87  soft-tissue]
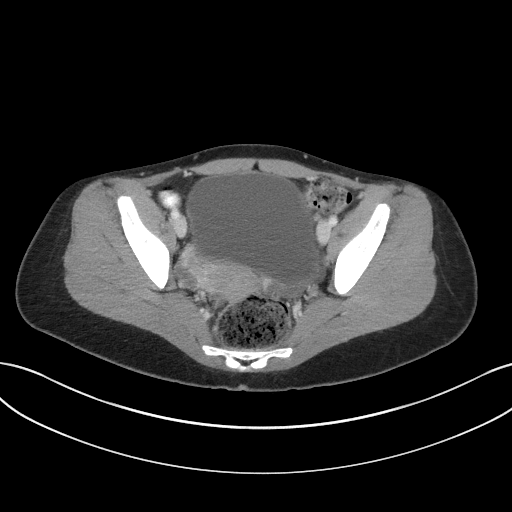
[im 30/87  soft-tissue]
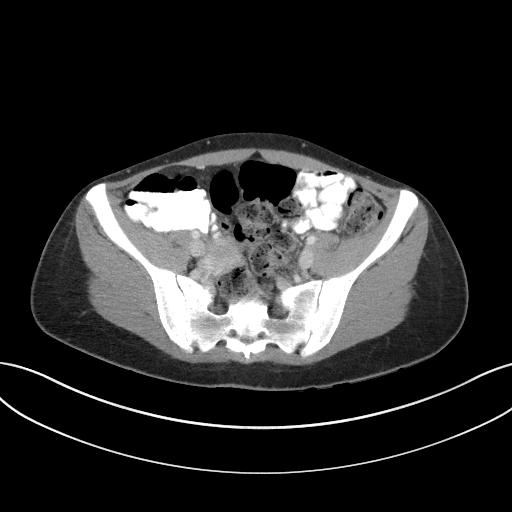
[im 38/87  soft-tissue]
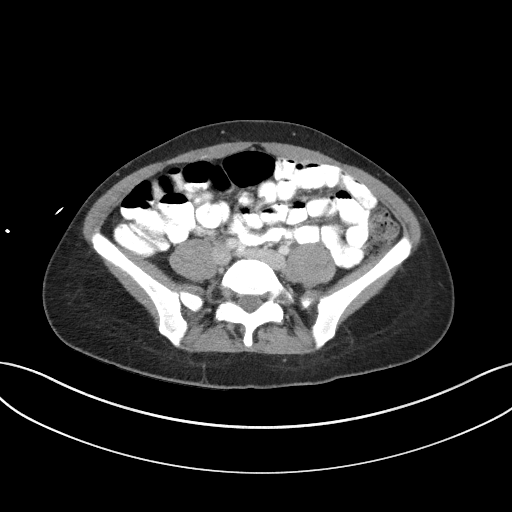
[im 45/87  soft-tissue]
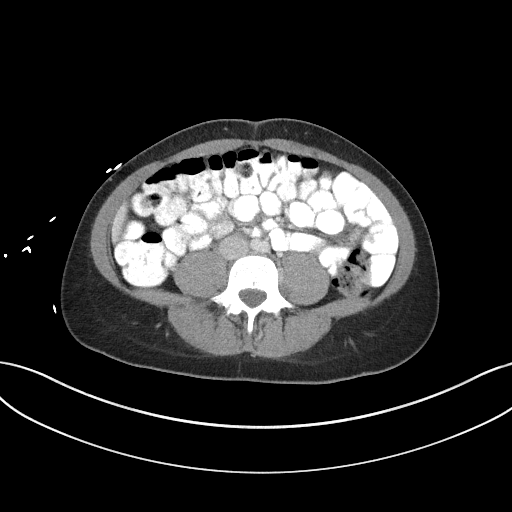
[im 49/87  soft-tissue]
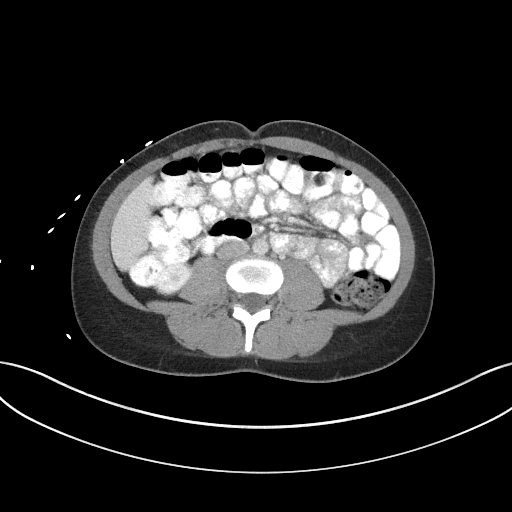
[im 57/87  soft-tissue]
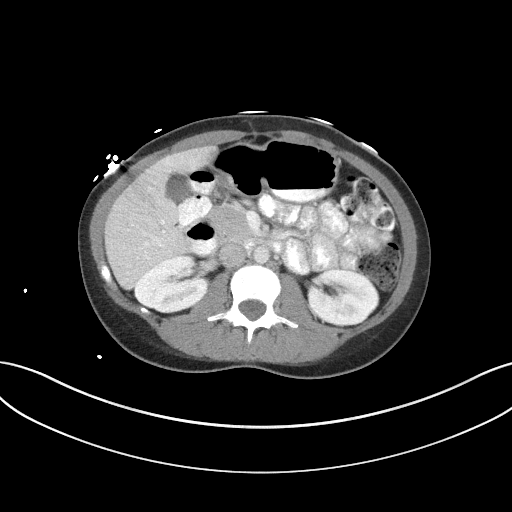
[im 57/87  bone]
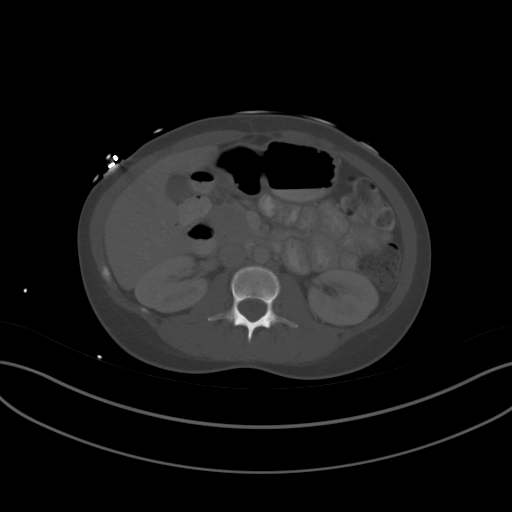
[im 64/87  soft-tissue]
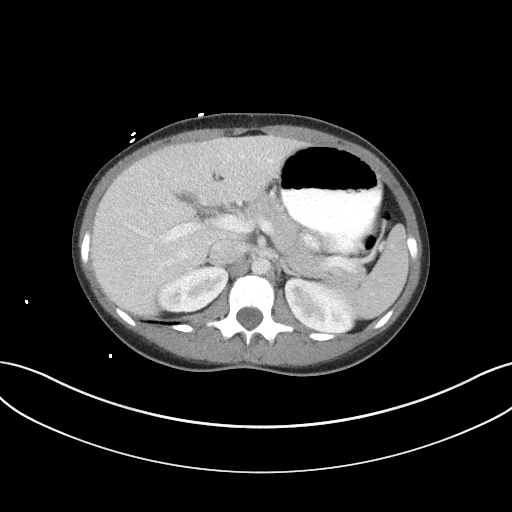
[im 68/87  soft-tissue]
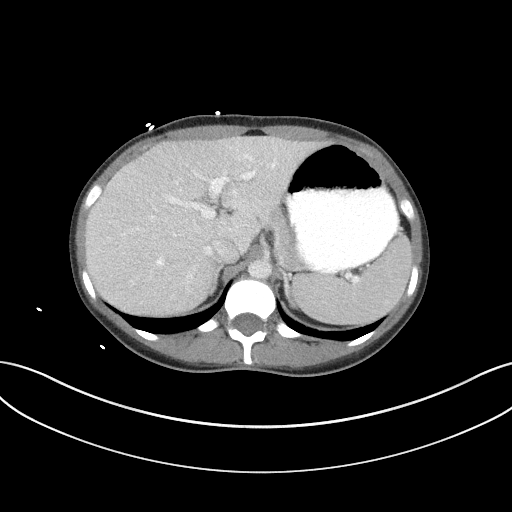
[im 75/87  soft-tissue]
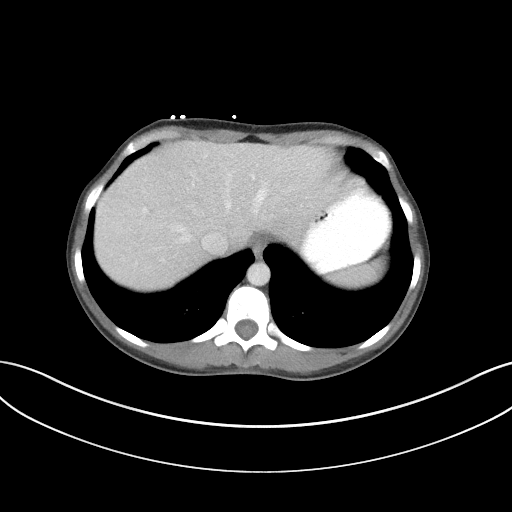
[im 83/87  soft-tissue]
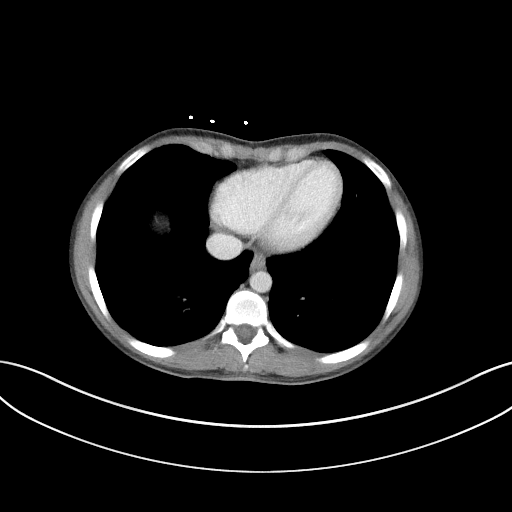

[Series 5: coronal st · coronal · 0.70mm/px · 3 of 70 slices shown]
[im 24/70  soft-tissue]
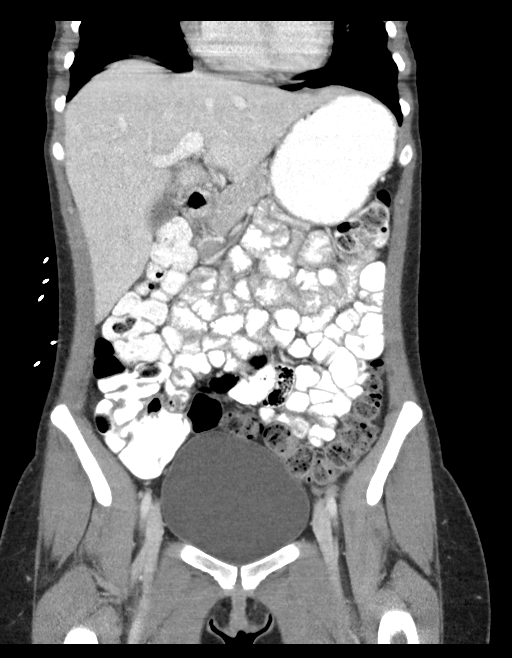
[im 31/70  soft-tissue]
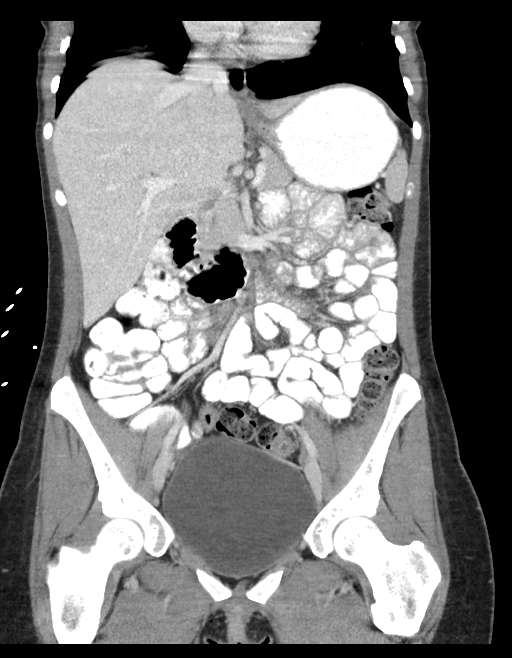
[im 39/70  soft-tissue]
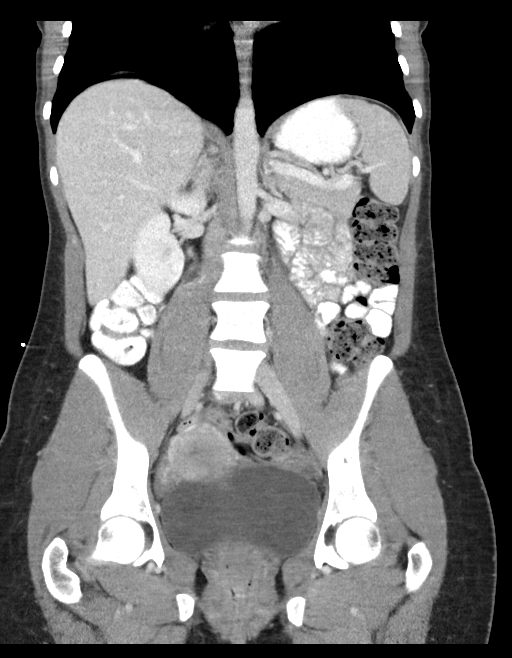

[16 of 46 positions shown; findings below may reference images not displayed]

FINDINGS: The visualized lung bases are clear.

The liver and spleen are unremarkable in appearance. The gallbladder
is within normal limits. The pancreas and adrenal glands are
unremarkable.

The kidneys are unremarkable in appearance. There is no evidence of
hydronephrosis. No renal or ureteral stones are seen. No perinephric
stranding is appreciated.

No free fluid is identified. The small bowel is unremarkable in
appearance. The stomach is within normal limits. No acute vascular
abnormalities are seen.

The appendix is normal in caliber, without evidence of appendicitis.
Contrast progresses to the level of the splenic flexure of the
colon. The colon is grossly unremarkable in appearance.

The bladder is moderately distended and grossly unremarkable. The
uterus is grossly unremarkable in appearance. The ovaries are
relatively symmetric. No suspicious adnexal masses are seen. No
inguinal lymphadenopathy is seen.

No acute osseous abnormalities are identified.
IMPRESSION: Unremarkable contrast-enhanced CT of the abdomen and pelvis.

## 2017-08-12 ENCOUNTER — Telehealth: Payer: Self-pay

## 2017-08-12 NOTE — Telephone Encounter (Signed)
Pt calling to notify Dr. Tiburcio PeaHarris she is no longer breastfeeding and would like to start back on the birth control she was taking prior to pregnancy. [facility]   [facility] and requested she call back to confirm pharmacy.

## 2017-08-15 NOTE — Telephone Encounter (Signed)
Left additional msg for pt to confirm pharmacy.

## 2017-08-16 NOTE — Telephone Encounter (Signed)
Pt confirmed pharmacy for [facility]. Please send new rx. Thank you.

## 2017-08-17 ENCOUNTER — Other Ambulatory Visit: Payer: Self-pay | Admitting: Obstetrics & Gynecology

## 2017-08-17 MED ORDER — LEVONORGEST-ETH ESTRAD 91-DAY 0.15-0.03 &0.01 MG PO TABS: 1.0000 | ORAL_TABLET | Freq: Every day | ORAL | 4 refills | Status: DC

## 2017-08-17 NOTE — Telephone Encounter (Signed)
ERx done

## 2017-08-30 ENCOUNTER — Ambulatory Visit: Payer: BLUE CROSS/BLUE SHIELD | Admitting: Family Medicine

## 2017-09-16 ENCOUNTER — Ambulatory Visit: Payer: BLUE CROSS/BLUE SHIELD | Admitting: Obstetrics & Gynecology

## 2017-10-07 ENCOUNTER — Ambulatory Visit (INDEPENDENT_AMBULATORY_CARE_PROVIDER_SITE_OTHER): Payer: No Typology Code available for payment source | Admitting: Obstetrics & Gynecology

## 2017-10-07 ENCOUNTER — Encounter: Payer: Self-pay | Admitting: Obstetrics & Gynecology

## 2017-10-07 VITALS — BP 100/60 | Ht 62.0 in | Wt 121.0 lb

## 2017-10-07 DIAGNOSIS — Z0000 Encounter for general adult medical examination without abnormal findings: Secondary | ICD-10-CM | POA: Diagnosis not present

## 2017-10-07 DIAGNOSIS — Z124 Encounter for screening for malignant neoplasm of cervix: Secondary | ICD-10-CM | POA: Diagnosis not present

## 2017-10-07 DIAGNOSIS — Z3041 Encounter for surveillance of contraceptive pills: Secondary | ICD-10-CM | POA: Insufficient documentation

## 2017-10-07 DIAGNOSIS — N898 Other specified noninflammatory disorders of vagina: Secondary | ICD-10-CM | POA: Diagnosis not present

## 2017-10-07 DIAGNOSIS — Z3045 Encounter for surveillance of transdermal patch hormonal contraceptive device: Secondary | ICD-10-CM

## 2017-10-07 MED ORDER — NORELGESTROMIN-ETH ESTRADIOL 150-35 MCG/24HR TD PTWK: 1.0000 | MEDICATED_PATCH | TRANSDERMAL | 3 refills | Status: DC

## 2017-10-07 NOTE — Progress Notes (Signed)
HPI:      [name] is a 32 y.o. G1P1001 who LMP was Patient's last menstrual period was 08/22/2017., she presents today for her annual examination. The patient has no complaints today. The patient is sexually active. Her last pap: approximate date 2018 and was normal. The patient does perform self breast exams.  There is notable family history of breast or ovarian cancer in her family.  The patient has regular exercise: yes.  The patient denies current symptoms of depression.  Pt reports in summer months she has more of a vag d/c.  No burn or itch.  GYN History: Contraception: OCP (estrogen/progesterone).  Misses pills w BTB at times. NSVD last Dec.  Bottle feeding, in daycare.   Pt works at [employer].  PMHx: Past Medical History:  Diagnosis Date  . Arthritis   . Costochondritis    Past Surgical History:  Procedure Laterality Date  . WISDOM TOOTH EXTRACTION  2011   Family History  Problem Relation Age of Onset  . Hypertension Mother   . Stroke Maternal Grandmother   . Cancer Maternal Grandfather        Lung  . Cancer Paternal Grandfather        lung   Social History   Tobacco Use  . Smoking status: Never Smoker  . Smokeless tobacco: Never Used  Substance Use Topics  . Alcohol use: Yes    Alcohol/week: 0.0 oz    Comment: occasionally  . Drug use: No    Current Outpatient Medications:  .  ibuprofen (ADVIL,MOTRIN) 600 MG tablet, Take 1 tablet (600 mg total) by mouth every 6 (six) hours., Disp: 30 tablet, Rfl: 0 .  Levonorgestrel-Ethinyl Estradiol (SEASONIQUE) 0.15-0.03 &0.01 MG tablet, Take 1 tablet by mouth daily., Disp: 1 Package, Rfl: 4 .  norelgestromin-ethinyl estradiol Burr Medico) 150-35 MCG/24HR transdermal patch, Place 1 patch onto the skin once a week. One patch weekly for 11 weeks, then one week off., Disp: 11 patch, Rfl: 3 .  Prenatal Vit-Fe Fumarate-FA (PRENATAL VITAMIN PO), Take by mouth., Disp: , Rfl:  Allergies: Patient has no known allergies.  Review  of Systems  Constitutional: Negative for chills, fever and malaise/fatigue.  HENT: Negative for congestion, sinus pain and sore throat.   Eyes: Negative for blurred vision and pain.  Respiratory: Negative for cough and wheezing.   Cardiovascular: Negative for chest pain and leg swelling.  Gastrointestinal: Negative for abdominal pain, constipation, diarrhea, heartburn, nausea and vomiting.  Genitourinary: Negative for dysuria, frequency, hematuria and urgency.  Musculoskeletal: Negative for back pain, joint pain, myalgias and neck pain.  Skin: Negative for itching and rash.  Neurological: Negative for dizziness, tremors and weakness.  Endo/Heme/Allergies: Does not bruise/bleed easily.  Psychiatric/Behavioral: Negative for depression. The patient is not nervous/anxious and does not have insomnia.     Objective: BP 100/60   Ht 5\' 2"  (1.575 m)   Wt 121 lb (54.9 kg)   LMP 08/22/2017   BMI 22.13 kg/m   Filed Weights   10/07/17 1521  Weight: 121 lb (54.9 kg)   Body mass index is 22.13 kg/m. Physical Exam  Constitutional: She is oriented to person, place, and time. She appears well-developed and well-nourished. No distress.  Genitourinary: Rectum normal, vagina normal and uterus normal. Pelvic exam was performed with patient supine. There is no rash or lesion on the right labia. There is no rash or lesion on the left labia. Vagina exhibits no lesion. No bleeding in the vagina. Right adnexum does not display  mass and does not display tenderness. Left adnexum does not display mass and does not display tenderness. Cervix does not exhibit motion tenderness, lesion, friability or polyp.   Uterus is mobile and midaxial. Uterus is not enlarged or exhibiting a mass.  HENT:  Head: Normocephalic and atraumatic. Head is without laceration.  Right Ear: Hearing normal.  Left Ear: Hearing normal.  Nose: No epistaxis.  No foreign bodies.  Mouth/Throat: Uvula is midline, oropharynx is clear and moist  and mucous membranes are normal.  Eyes: Pupils are equal, round, and reactive to light.  Neck: Normal range of motion. Neck supple. No thyromegaly present.  Cardiovascular: Normal rate and regular rhythm. Exam reveals no gallop and no friction rub.  No murmur heard. Pulmonary/Chest: Effort normal and breath sounds normal. No respiratory distress. She has no wheezes. Right breast exhibits no mass, no skin change and no tenderness. Left breast exhibits no mass, no skin change and no tenderness.  Abdominal: Soft. Bowel sounds are normal. She exhibits no distension. There is no tenderness. There is no rebound.  Musculoskeletal: Normal range of motion.  Neurological: She is alert and oriented to person, place, and time. No cranial nerve deficit.  Skin: Skin is warm and dry.  Psychiatric: She has a normal mood and affect. Judgment normal.  Vitals reviewed.  Assessment:  ANNUAL EXAM 1. Annual physical exam   2. Screening for cervical cancer   3. Encounter for surveillance of transdermal patch hormonal contraceptive device   4. Vaginal discharge    Screening Plan:            1.  Cervical Screening-  Pap smear done today  2.  Counseling for contraception: wishes to change as misses pills at time; patch discussed and Rx; prefers similar to pill in that period once every 3 mos.      F/U  Return in about 1 year (around 10/08/2018) for Annual.  Annamarie MajorPaul Tayvon Culley, MD, Merlinda FrederickFACOG [facility], [facility] 10/07/2017  3:51 PM

## 2017-10-07 NOTE — Patient Instructions (Signed)

## 2017-10-11 LAB — NUSWAB BV AND CANDIDA, NAA
CANDIDA GLABRATA, NAA: NEGATIVE
Candida albicans, NAA: NEGATIVE

## 2017-10-13 LAB — IGP, APTIMA HPV
HPV APTIMA: NEGATIVE
PAP SMEAR COMMENT: 0

## 2018-01-16 ENCOUNTER — Ambulatory Visit (INDEPENDENT_AMBULATORY_CARE_PROVIDER_SITE_OTHER): Payer: No Typology Code available for payment source | Admitting: Obstetrics & Gynecology

## 2018-01-16 ENCOUNTER — Encounter: Payer: Self-pay | Admitting: Obstetrics & Gynecology

## 2018-01-16 VITALS — BP 100/60 | Ht 62.0 in | Wt 121.0 lb

## 2018-01-16 DIAGNOSIS — N921 Excessive and frequent menstruation with irregular cycle: Secondary | ICD-10-CM

## 2018-01-16 NOTE — Progress Notes (Signed)
  History of Present Illness:  Vickie Ruiz is a 32 y.o. who was started on  Current Meds  Medication Sig  . norelgestromin-ethinyl estradiol Burr Medico) 150-35 MCG/24HR transdermal patch Place 1 patch onto the skin once a week. One patch weekly for 11 weeks, then one week off.   approximately 4 months ago. Since that time, she states that her symptoms of bleeding were fine until 2 weeks ago w irreg bleeding ever since.  PMHx: She  has a past medical history of Arthritis and Costochondritis. Also,  has a past surgical history that includes Wisdom tooth extraction (2011)., family history includes Cancer in her maternal grandfather and paternal grandfather; Hypertension in her mother; Stroke in her maternal grandmother.,  reports that she has never smoked. She has never used smokeless tobacco. She reports that she drinks alcohol. She reports that she does not use drugs. Current Meds  Medication Sig  . norelgestromin-ethinyl estradiol Burr Medico) 150-35 MCG/24HR transdermal patch Place 1 patch onto the skin once a week. One patch weekly for 11 weeks, then one week off.  . Also, has No Known Allergies..  Review of Systems  All other systems reviewed and are negative.  Physical Exam:  BP 100/60   Ht 5\' 2"  (1.575 m)   Wt 121 lb (54.9 kg)   LMP 10/07/2017   BMI 22.13 kg/m  Body mass index is 22.13 kg/m. Constitutional: Well nourished, well developed female in no acute distress.  Abdomen: diffusely non tender to palpation, non distended, and no masses, hernias Neuro: Grossly intact Psych:  Normal mood and affect.    Assessment:   Metrorrhagia    -  Primary and new     Medication treatment is going well for her BC.  She needs to have a patch free week to have a normal cycle.  Most likely this recent episode of bleeding is because it has been >11 weeks since she has allowed a patch free week.  Pros and cons of suppression therapy for periods discussed.  Will leave off for a week and then  restart, and will mark calender for timing of next patch free week.  She was amenable to this plan and we will see her back for annual/PRN.  A total of 15 minutes were spent face-to-face with the patient during this encounter and over half of that time dealt with counseling and coordination of care.  Annamarie Major, MD, Merl[facility] Frederick Ob/Gyn, [facility] 01/16/2018  3:56 PM

## 2018-02-28 IMAGING — US US TRANSVAGINAL NON-OB
1 series · 13 of 25 positions shown · non-contrast
Comparison: None.

CLINICAL DATA: Acute onset of left lower quadrant abdominal pain.
Initial encounter.

EXAM:
TRANSABDOMINAL AND TRANSVAGINAL ULTRASOUND OF PELVIS
DOPPLER ULTRASOUND OF OVARIES
TECHNIQUE: Both transabdominal and transvaginal ultrasound examinations of the
pelvis were performed. Transabdominal technique was performed for
global imaging of the pelvis including uterus, ovaries, adnexal
regions, and pelvic cul-de-sac.
It was necessary to proceed with endovaginal exam following the
transabdominal exam to visualize the uterus and ovaries in greater
detail. Color and duplex Doppler ultrasound was utilized to evaluate
blood flow to the ovaries.

[Series 1: us transvaginal non-ob · 0.21mm/px · 13 of 153 slices shown]
[im 1/153]
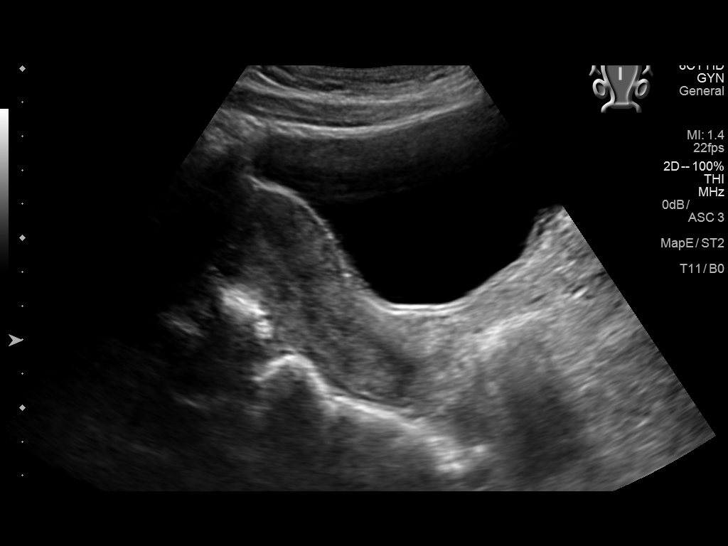
[im 13/153]
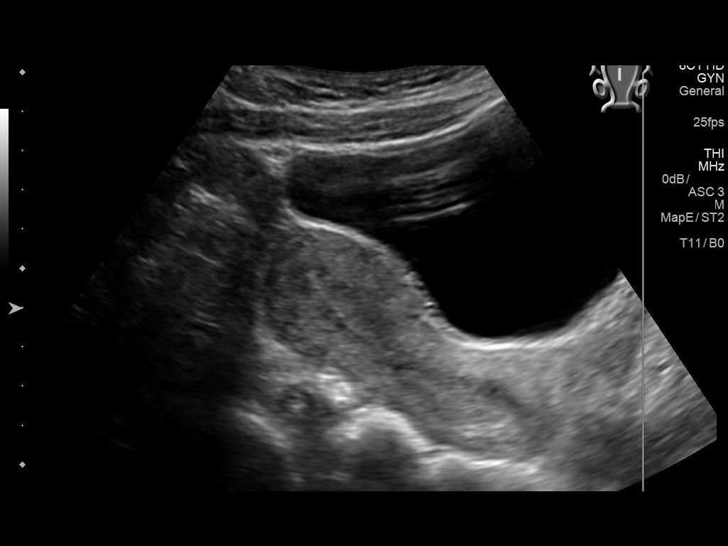
[im 26/153]
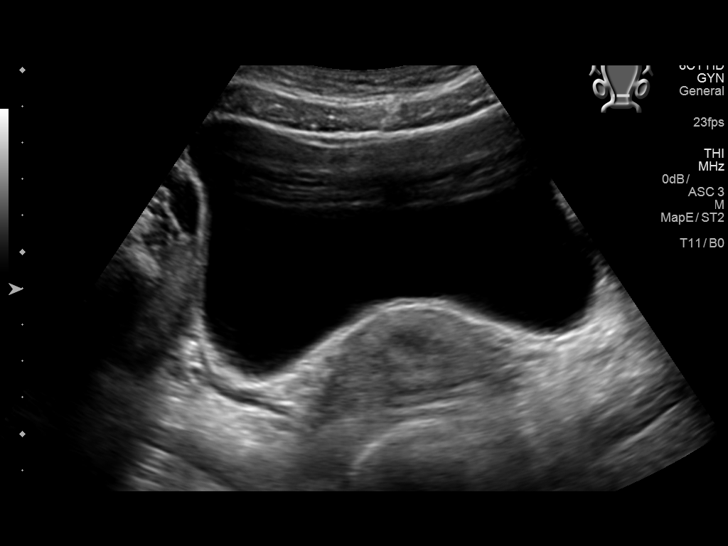
[im 39/153]
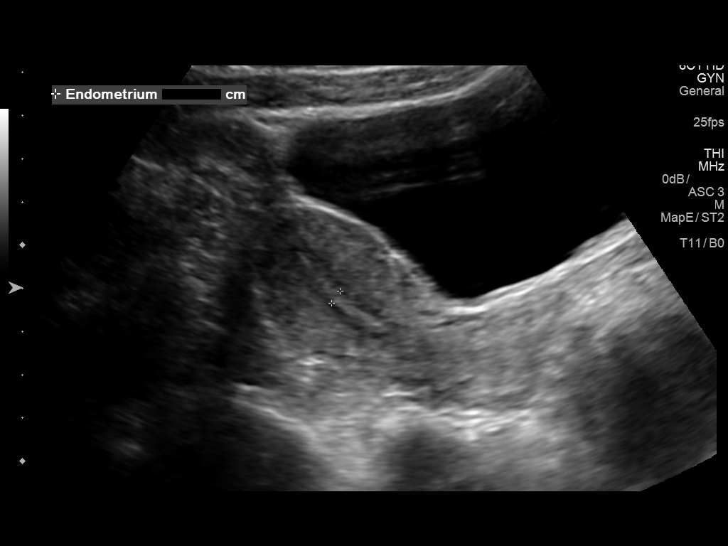
[im 51/153]
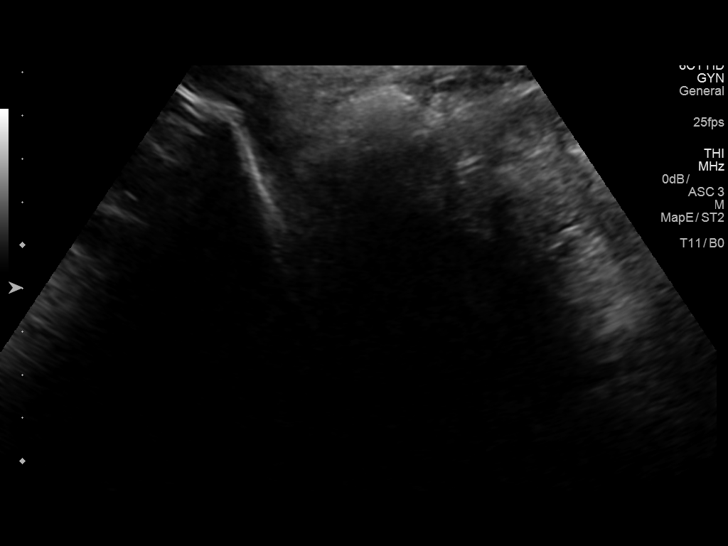
[im 64/153]
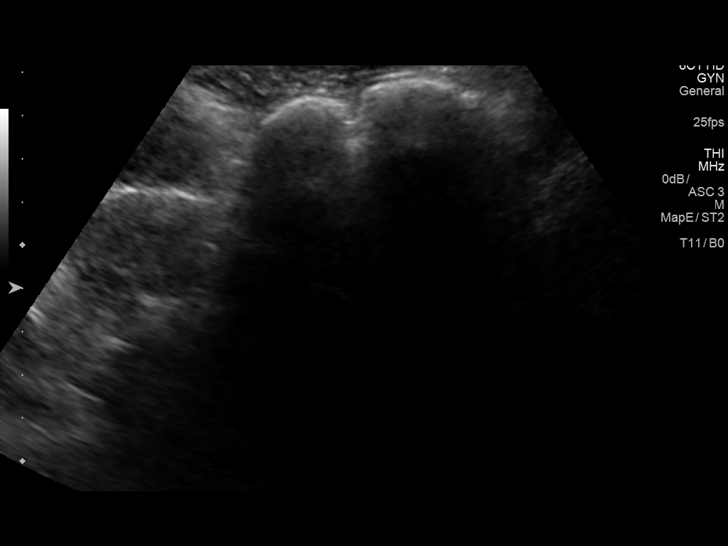
[im 77/153]
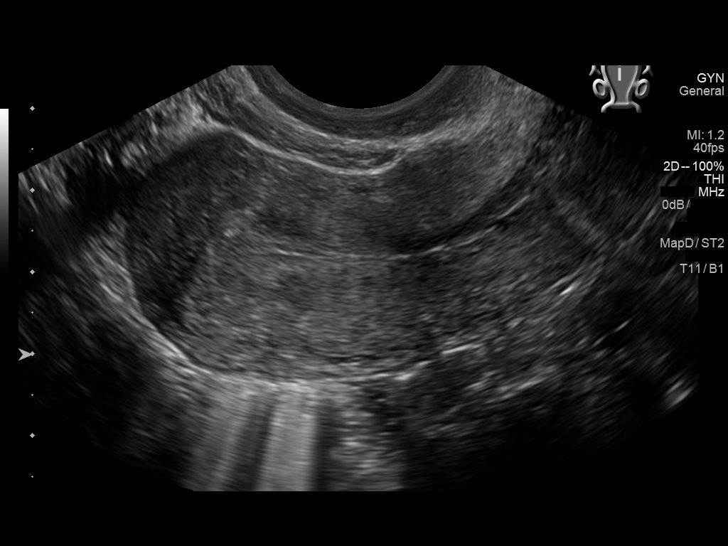
[im 89/153]
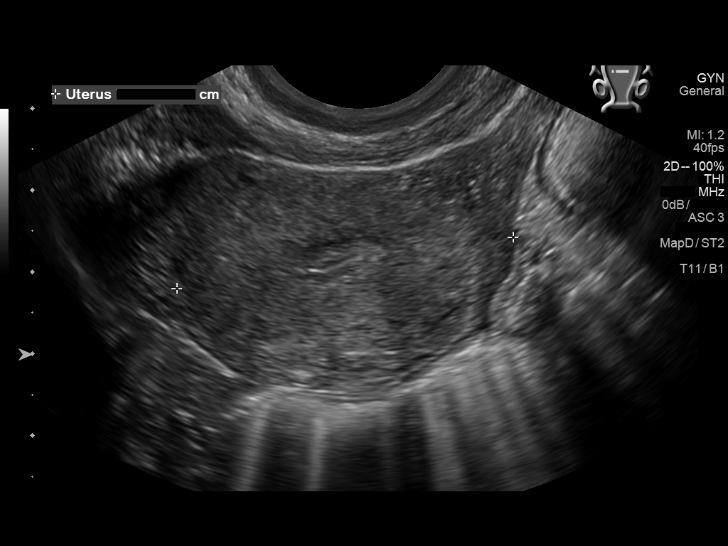
[im 102/153]
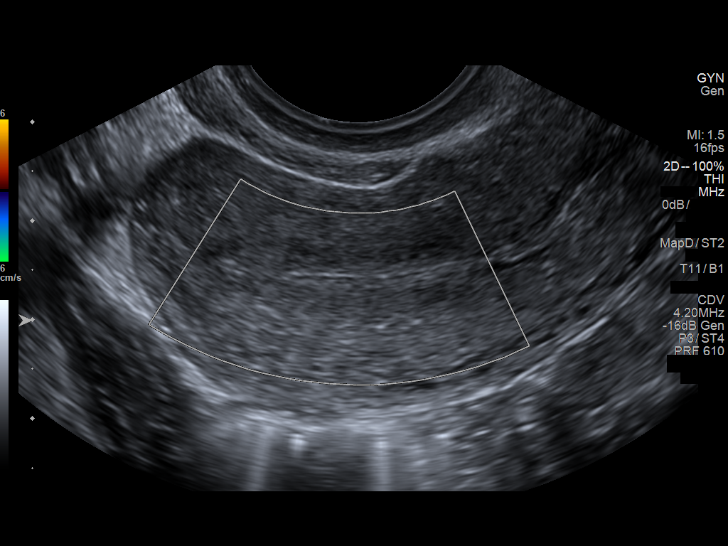
[im 115/153]
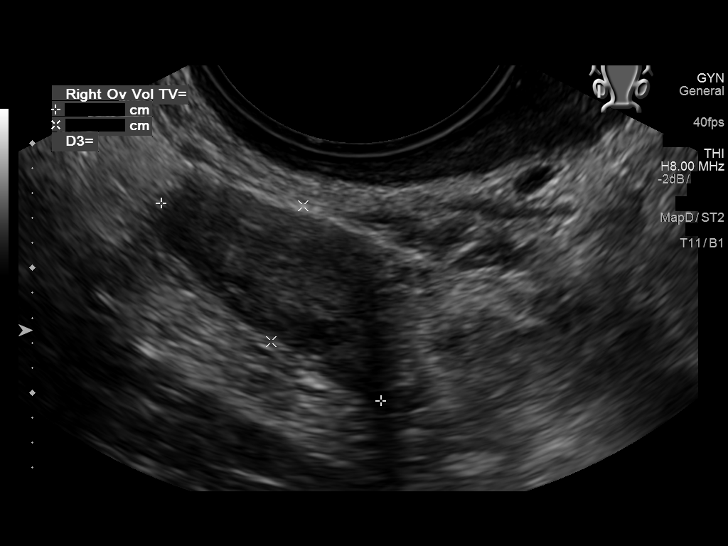
[im 127/153]
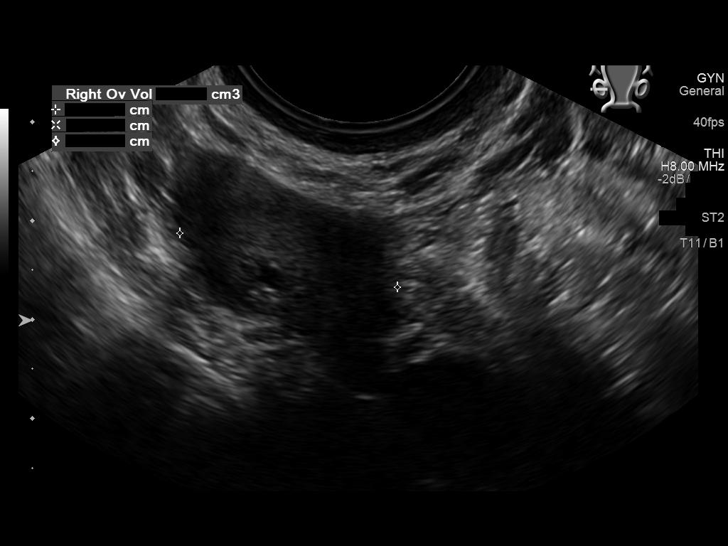
[im 140/153]
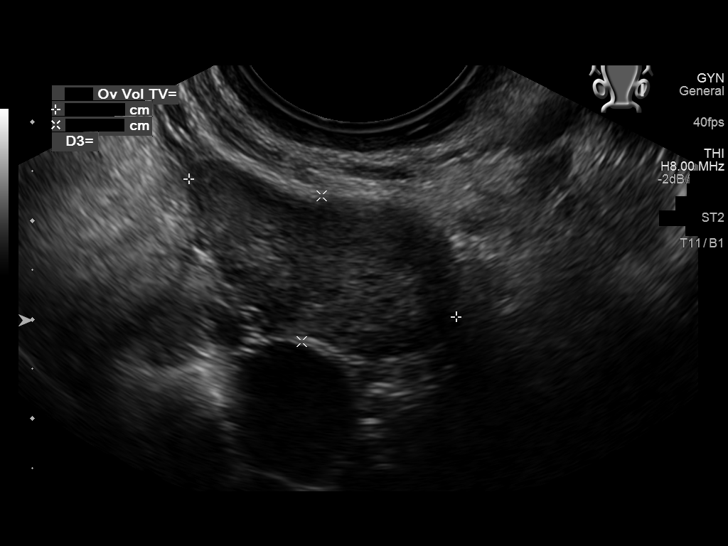
[im 153/153]
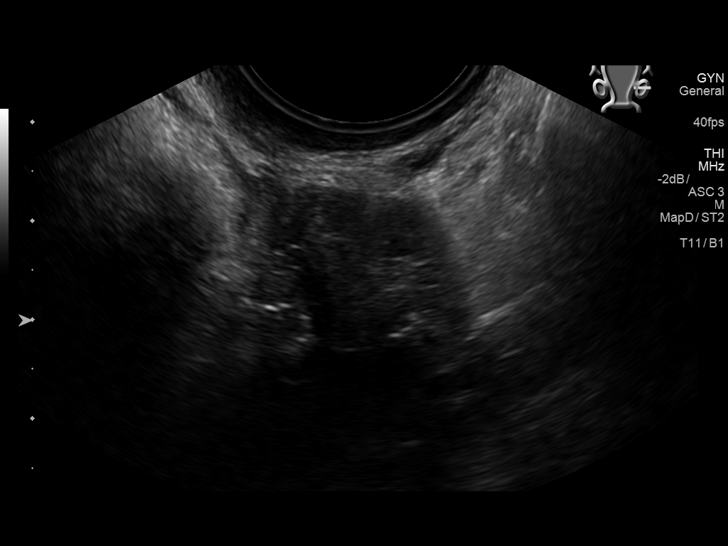

[13 of 25 positions shown; findings below may reference images not displayed]

FINDINGS: Uterus

Measurements: 7.2 x 3.1 x 3.6 cm. No fibroids or other mass
visualized.

Endometrium

Thickness: 0.5 cm.  No focal abnormality visualized.

Right ovary

Measurements: 2.3 x 1.1 x 2.2 cm. Normal appearance/no adnexal mass.

Left ovary

Measurements: 3.0 x 1.5 x 1.6 cm. Normal appearance/no adnexal mass.

Pulsed Doppler evaluation of both ovaries demonstrates normal
low-resistance arterial and venous waveforms.

Other findings

Trace free fluid is seen within the pelvic cul-de-sac.
IMPRESSION: Unremarkable pelvic ultrasound.  No evidence for ovarian torsion.

## 2018-10-19 ENCOUNTER — Encounter: Payer: Self-pay | Admitting: Obstetrics & Gynecology

## 2018-10-19 ENCOUNTER — Other Ambulatory Visit: Payer: Self-pay

## 2018-10-19 ENCOUNTER — Ambulatory Visit (INDEPENDENT_AMBULATORY_CARE_PROVIDER_SITE_OTHER): Payer: No Typology Code available for payment source | Admitting: Obstetrics & Gynecology

## 2018-10-19 VITALS — BP 100/60 | Ht 62.0 in | Wt 118.0 lb

## 2018-10-19 DIAGNOSIS — Z3045 Encounter for surveillance of transdermal patch hormonal contraceptive device: Secondary | ICD-10-CM

## 2018-10-19 DIAGNOSIS — Z01419 Encounter for gynecological examination (general) (routine) without abnormal findings: Secondary | ICD-10-CM | POA: Diagnosis not present

## 2018-10-19 MED ORDER — XULANE 150-35 MCG/24HR TD PTWK: 1.0000 | MEDICATED_PATCH | TRANSDERMAL | 3 refills | Status: DC

## 2018-10-19 NOTE — Progress Notes (Signed)
HPI:      [name] is a 33 y.o. G1P1001 who LMP was Patient's last menstrual period was 10/07/2018., she presents today for her annual examination. The patient has no complaints today. The patient is sexually active. Her last pap: approximate date 2019 and was normal. The patient does perform self breast exams.  There is no notable family history of breast or ovarian cancer in her family.  The patient has regular exercise: yes.  The patient denies current symptoms of depression.    GYN History: Contraception: Ortho-Evra patches weekly  PMHx: Past Medical History:  Diagnosis Date  . Arthritis   . Costochondritis    Past Surgical History:  Procedure Laterality Date  . WISDOM TOOTH EXTRACTION  2011   Family History  Problem Relation Age of Onset  . Hypertension Mother   . Stroke Maternal Grandmother   . Cancer Maternal Grandfather        Lung  . Cancer Paternal Grandfather        lung   Social History   Tobacco Use  . Smoking status: Never Smoker  . Smokeless tobacco: Never Used  Substance Use Topics  . Alcohol use: Yes    Alcohol/week: 0.0 standard drinks    Comment: occasionally  . Drug use: No    Current Outpatient Medications:  .  norelgestromin-ethinyl estradiol Marilu Favre) 150-35 MCG/24HR transdermal patch, Place 1 patch onto the skin once a week. One patch weekly for 11 weeks, then one week off., Disp: 11 patch, Rfl: 3 .  Prenatal Vit-Fe Fumarate-FA (PRENATAL VITAMIN PO), Take by mouth., Disp: , Rfl:  Allergies: Patient has no known allergies.  Review of Systems  Constitutional: Negative for chills, fever and malaise/fatigue.  HENT: Negative for congestion, sinus pain and sore throat.   Eyes: Negative for blurred vision and pain.  Respiratory: Negative for cough and wheezing.   Cardiovascular: Negative for chest pain and leg swelling.  Gastrointestinal: Negative for abdominal pain, constipation, diarrhea, heartburn, nausea and vomiting.   Genitourinary: Negative for dysuria, frequency, hematuria and urgency.  Musculoskeletal: Negative for back pain, joint pain, myalgias and neck pain.  Skin: Negative for itching and rash.  Neurological: Negative for dizziness, tremors and weakness.  Endo/Heme/Allergies: Does not bruise/bleed easily.  Psychiatric/Behavioral: Negative for depression. The patient is not nervous/anxious and does not have insomnia.     Objective: BP 100/60   Ht 5\' 2"  (1.575 m)   Wt 118 lb (53.5 kg)   LMP 10/07/2018   BMI 21.58 kg/m   Filed Weights   10/19/18 1529  Weight: 118 lb (53.5 kg)   Body mass index is 21.58 kg/m. Physical Exam Constitutional:      General: She is not in acute distress.    Appearance: She is well-developed.  Genitourinary:     Pelvic exam was performed with patient supine.     Vagina, uterus and rectum normal.     No lesions in the vagina.     No vaginal bleeding.     No cervical motion tenderness, friability, lesion or polyp.     Uterus is mobile.     Uterus is not enlarged.     No uterine mass detected.    Uterus is midaxial.     No right or left adnexal mass present.     Right adnexa not tender.     Left adnexa not tender.  HENT:     Head: Normocephalic and atraumatic. No laceration.     Right Ear: Hearing  normal.     Left Ear: Hearing normal.     Mouth/Throat:     Pharynx: Uvula midline.  Eyes:     Pupils: Pupils are equal, round, and reactive to light.  Neck:     Musculoskeletal: Normal range of motion and neck supple.     Thyroid: No thyromegaly.  Cardiovascular:     Rate and Rhythm: Normal rate and regular rhythm.     Heart sounds: No murmur. No friction rub. No gallop.   Pulmonary:     Effort: Pulmonary effort is normal. No respiratory distress.     Breath sounds: Normal breath sounds. No wheezing.  Chest:     Breasts:        Right: No mass, skin change or tenderness.        Left: No mass, skin change or tenderness.  Abdominal:     General: Bowel  sounds are normal. There is no distension.     Palpations: Abdomen is soft.     Tenderness: There is no abdominal tenderness. There is no rebound.  Musculoskeletal: Normal range of motion.  Neurological:     Mental Status: She is alert and oriented to person, place, and time.     Cranial Nerves: No cranial nerve deficit.  Skin:    General: Skin is warm and dry.  Psychiatric:        Judgment: Judgment normal.  Vitals signs reviewed.     Assessment:  ANNUAL EXAM 1. Women's annual routine gynecological examination   2. Encounter for surveillance of transdermal patch hormonal contraceptive device      Screening Plan:            1.  Cervical Screening-  Pap smear schedule reviewed with patient  2. Breast screening- Exam annually and mammogram>40 planned   3. Colonoscopy every 10 years, Hemoccult testing - after age 32  4. Labs managed by PCP  5. Counseling for contraception: cont weekly patch therapy for 11 weeks on, 1 week off     F/U  Return in about 1 year (around 10/19/2019) for Annual.  Annamarie MajorPaul Brittnee Gaetano, MD, Merlinda FrederickFACOG [facility], [facility] 10/19/2018  3:59 PM

## 2018-10-19 NOTE — Patient Instructions (Signed)
PAP every three years Labs yearly (with PCP) Patch Rx to send out pharmacy, receive in mail

## 2019-10-24 ENCOUNTER — Other Ambulatory Visit: Payer: Self-pay

## 2019-10-24 ENCOUNTER — Ambulatory Visit (INDEPENDENT_AMBULATORY_CARE_PROVIDER_SITE_OTHER): Payer: BC Managed Care – PPO | Admitting: Obstetrics & Gynecology

## 2019-10-24 ENCOUNTER — Encounter: Payer: Self-pay | Admitting: Obstetrics & Gynecology

## 2019-10-24 VITALS — BP 120/80 | Ht 62.0 in | Wt 113.0 lb

## 2019-10-24 DIAGNOSIS — Z01419 Encounter for gynecological examination (general) (routine) without abnormal findings: Secondary | ICD-10-CM

## 2019-10-24 DIAGNOSIS — Z3045 Encounter for surveillance of transdermal patch hormonal contraceptive device: Secondary | ICD-10-CM

## 2019-10-24 MED ORDER — XULANE 150-35 MCG/24HR TD PTWK: 1.0000 | MEDICATED_PATCH | TRANSDERMAL | 3 refills | Status: DC

## 2019-10-24 NOTE — Progress Notes (Signed)
HPI:      [name] is a 34 y.o. G1P1001 who LMP was Patient's last menstrual period was 10/04/2019., she presents today for her annual examination. The patient has no complaints today. The patient is sexually active. Her last pap: approximate date 2019 and was normal. The patient does perform self breast exams.  There is notable family history of breast or ovarian cancer in her family.  The patient has regular exercise: no.  The patient denies current symptoms of depression.    GYN History: Contraception: Ortho-Evra patches weekly  PMHx: Past Medical History:  Diagnosis Date   Arthritis    Costochondritis    Past Surgical History:  Procedure Laterality Date   WISDOM TOOTH EXTRACTION  2011   Family History  Problem Relation Age of Onset   Hypertension Mother    Stroke Maternal Grandmother    Cancer Maternal Grandfather        Lung   Cancer Paternal Grandfather        lung   Social History   Tobacco Use   Smoking status: Never Smoker   Smokeless tobacco: Never Used  Vaping Use   Vaping Use: Never used  Substance Use Topics   Alcohol use: Yes    Alcohol/week: 0.0 standard drinks    Comment: occasionally   Drug use: No    Current Outpatient Medications:    norelgestromin-ethinyl estradiol Burr Medico) 150-35 MCG/24HR transdermal patch, Place 1 patch onto the skin once a week. One patch weekly for 11 weeks, then one week off., Disp: 11 patch, Rfl: 3   Prenatal Vit-Fe Fumarate-FA (PRENATAL VITAMIN PO), Take by mouth. (Patient not taking: Reported on 10/24/2019), Disp: , Rfl:  Allergies: Patient has no known allergies.  Review of Systems  Constitutional: Negative for chills, fever and malaise/fatigue.  HENT: Negative for congestion, sinus pain and sore throat.   Eyes: Negative for blurred vision and pain.  Respiratory: Negative for cough and wheezing.   Cardiovascular: Negative for chest pain and leg swelling.  Gastrointestinal: Negative for  abdominal pain, constipation, diarrhea, heartburn, nausea and vomiting.  Genitourinary: Negative for dysuria, frequency, hematuria and urgency.  Musculoskeletal: Negative for back pain, joint pain, myalgias and neck pain.  Skin: Negative for itching and rash.  Neurological: Negative for dizziness, tremors and weakness.  Endo/Heme/Allergies: Does not bruise/bleed easily.  Psychiatric/Behavioral: Negative for depression. The patient is not nervous/anxious and does not have insomnia.     Objective: BP 120/80    Ht 5\' 2"  (1.575 m)    Wt 113 lb (51.3 kg)    LMP 10/04/2019    BMI 20.67 kg/m   Filed Weights   10/24/19 0956  Weight: 113 lb (51.3 kg)   Body mass index is 20.67 kg/m. Physical Exam Constitutional:      General: She is not in acute distress.    Appearance: She is well-developed.  Genitourinary:     Pelvic exam was performed with patient supine.     Vagina, uterus and rectum normal.     No lesions in the vagina.     No vaginal bleeding.     No cervical motion tenderness, friability, lesion or polyp.     Uterus is mobile.     Uterus is not enlarged.     No uterine mass detected.    Uterus is midaxial.     No right or left adnexal mass present.     Right adnexa not tender.     Left adnexa not tender.  HENT:     Head: Normocephalic and atraumatic. No laceration.     Right Ear: Hearing normal.     Left Ear: Hearing normal.     Mouth/Throat:     Pharynx: Uvula midline.  Eyes:     Pupils: Pupils are equal, round, and reactive to light.  Neck:     Thyroid: No thyromegaly.  Cardiovascular:     Rate and Rhythm: Normal rate and regular rhythm.     Heart sounds: No murmur heard.  No friction rub. No gallop.   Pulmonary:     Effort: Pulmonary effort is normal. No respiratory distress.     Breath sounds: Normal breath sounds. No wheezing.  Chest:     Breasts:        Right: No mass, skin change or tenderness.        Left: No mass, skin change or tenderness.  Abdominal:      General: Bowel sounds are normal. There is no distension.     Palpations: Abdomen is soft.     Tenderness: There is no abdominal tenderness. There is no rebound.  Musculoskeletal:        General: Normal range of motion.     Cervical back: Normal range of motion and neck supple.  Neurological:     Mental Status: She is alert and oriented to person, place, and time.     Cranial Nerves: No cranial nerve deficit.  Skin:    General: Skin is warm and dry.  Psychiatric:        Judgment: Judgment normal.  Vitals reviewed.     Assessment:  ANNUAL EXAM 1. Women's annual routine gynecological examination   2. Encounter for surveillance of transdermal patch hormonal contraceptive device      Screening Plan:            1.  Cervical Screening-  Pap smear schedule reviewed with patient  2. Breast screening- Exam annually and mammogram>40 planned   3. Labs managed by PCP  4. Counseling for contraception: patches for 5 weeks on, 1 week off as this works best for her and her sx's Encounter for surveillance of transdermal patch hormonal contraceptive device     F/U  Return in about 1 year (around 10/23/2020) for Annual.  Annamarie Major, MD, [facility] Frederick Ob[facility], [facility] 10/24/2019  10:20 AM

## 2019-10-24 NOTE — Patient Instructions (Signed)
Thank you for choosing [facility]. As part of our ongoing efforts to improve patient experience, we would appreciate your feedback. Please fill out the short survey that you will receive by mail or MyChart. Your opinion is important to us! -Dr Loistine Eberlin  

## 2020-05-09 ENCOUNTER — Telehealth: Payer: Self-pay

## 2020-05-09 NOTE — Telephone Encounter (Signed)
Patient states [facility] (pharmacy she has been getting her bc patch from) has dropped her rx. 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 [phone]

## 2020-05-09 NOTE — Telephone Encounter (Signed)
Spoke w/patient. Advised to contact local pharmacy [facility]ickie Ru[facility] and have them request a transfer of the remainder of her rx from [facility]. Patient will contact us back with any further questions/concerns.

## 2020-05-21 ENCOUNTER — Telehealth: Payer: Self-pay

## 2020-05-21 MED ORDER — XULANE 150-35 MCG/24HR TD PTWK: 1.0000 | MEDICATED_PATCH | TRANSDERMAL | 1 refills | Status: DC

## 2020-05-21 NOTE — Telephone Encounter (Signed)
Pt calling; needs new rx sent to local pharm as [facility] has discontinued her bc patch zafemy.  [phone]  Pt wants it sent to [facility] in [place].  Adv I would send it to them as soon as we hunt up.  Pt states they will notify her when it is ready for p/u.

## 2020-08-13 ENCOUNTER — Other Ambulatory Visit: Payer: Self-pay | Admitting: Obstetrics and Gynecology

## 2020-08-13 NOTE — Telephone Encounter (Signed)
Haven't seen this patient since 2018

## 2020-08-13 NOTE — Telephone Encounter (Signed)
Sch annual in July.  Refills done

## 2020-11-06 ENCOUNTER — Encounter: Payer: Self-pay | Admitting: Obstetrics & Gynecology

## 2020-11-06 ENCOUNTER — Other Ambulatory Visit: Payer: Self-pay

## 2020-11-06 ENCOUNTER — Ambulatory Visit (INDEPENDENT_AMBULATORY_CARE_PROVIDER_SITE_OTHER): Payer: BC Managed Care – PPO | Admitting: Obstetrics & Gynecology

## 2020-11-06 ENCOUNTER — Other Ambulatory Visit (HOSPITAL_COMMUNITY)
Admission: RE | Admit: 2020-11-06 | Discharge: 2020-11-06 | Disposition: A | Discharge status: Home / Self Care | Payer: BC Managed Care – PPO | Source: Ambulatory Visit | Attending: Obstetrics & Gynecology | Admitting: Obstetrics & Gynecology

## 2020-11-06 VITALS — BP 100/60 | Ht 62.0 in | Wt 116.0 lb

## 2020-11-06 DIAGNOSIS — Z124 Encounter for screening for malignant neoplasm of cervix: Secondary | ICD-10-CM | POA: Diagnosis not present

## 2020-11-06 DIAGNOSIS — Z3045 Encounter for surveillance of transdermal patch hormonal contraceptive device: Secondary | ICD-10-CM | POA: Diagnosis not present

## 2020-11-06 DIAGNOSIS — Z01419 Encounter for gynecological examination (general) (routine) without abnormal findings: Secondary | ICD-10-CM | POA: Diagnosis not present

## 2020-11-06 MED ORDER — XULANE 150-35 MCG/24HR TD PTWK: MEDICATED_PATCH | TRANSDERMAL | 8 refills | Status: AC

## 2020-11-06 NOTE — Progress Notes (Signed)
HPI:      [name] is a 35 y.o. G1P1001 who LMP was Patient's last menstrual period was 10/06/2020., she presents today for her annual examination. The patient has no complaints today. The patient is sexually active. Her last pap: approximate date 2019 and was normal. The patient does perform self breast exams.  There is notable family history of breast or ovarian cancer in her family.  The patient has regular exercise: yes.  The patient denies current symptoms of depression.    GYN History: Contraception: Ortho-Evra patches weekly  PMHx: Past Medical History:  Diagnosis Date   Arthritis    Costochondritis    Past Surgical History:  Procedure Laterality Date   WISDOM TOOTH EXTRACTION  2011   Family History  Problem Relation Age of Onset   Hypertension Mother    Stroke Maternal Grandmother    Breast cancer Maternal Grandmother 78   Cancer Maternal Grandfather        Lung   Cancer Paternal Grandfather        lung   Social History   Tobacco Use   Smoking status: Never   Smokeless tobacco: Never  Vaping Use   Vaping Use: Never used  Substance Use Topics   Alcohol use: Yes    Alcohol/week: 0.0 standard drinks    Comment: occasionally   Drug use: No    Current Outpatient Medications:    Prenatal Vit-Fe Fumarate-FA (PRENATAL VITAMIN PO), Take by mouth., Disp: , Rfl:    norelgestromin-ethinyl estradiol (XULANE) 150-35 MCG/24HR transdermal patch, UNWRAP AND APPLY 1 PATCH TO THE SKIN ONCE A WEEK FOR 5 WEEKS, THEN 1 WEEK OFF, Disp: 10 patch, Rfl: 8 Allergies: Patient has no known allergies.  Review of Systems  Constitutional:  Negative for chills, fever and malaise/fatigue.  HENT:  Negative for congestion, sinus pain and sore throat.   Eyes:  Negative for blurred vision and pain.  Respiratory:  Negative for cough and wheezing.   Cardiovascular:  Negative for chest pain and leg swelling.  Gastrointestinal:  Negative for abdominal pain, constipation, diarrhea,  heartburn, nausea and vomiting.  Genitourinary:  Negative for dysuria, frequency, hematuria and urgency.  Musculoskeletal:  Negative for back pain, joint pain, myalgias and neck pain.  Skin:  Negative for itching and rash.  Neurological:  Negative for dizziness, tremors and weakness.  Endo/Heme/Allergies:  Does not bruise/bleed easily.  Psychiatric/Behavioral:  Negative for depression. The patient is not nervous/anxious and does not have insomnia.    Objective: BP 100/60   Ht 5\' 2"  (1.575 m)   Wt 116 lb (52.6 kg)   LMP 10/06/2020   BMI 21.22 kg/m   Filed Weights   11/06/20 0855  Weight: 116 lb (52.6 kg)   Body mass index is 21.22 kg/m. Physical Exam Constitutional:      General: She is not in acute distress.    Appearance: She is well-developed.  Genitourinary:     Bladder, rectum and urethral meatus normal.     No lesions in the vagina.     Right Labia: No rash, tenderness or lesions.    Left Labia: No tenderness, lesions or rash.    No vaginal bleeding.      Right Adnexa: not tender and no mass present.    Left Adnexa: not tender and no mass present.    No cervical motion tenderness, friability, lesion or polyp.     Uterus is not enlarged.     No uterine mass detected.  Pelvic exam was performed with patient in the lithotomy position.  Breasts:    Right: No mass, skin change or tenderness.     Left: No mass, skin change or tenderness.  HENT:     Head: Normocephalic and atraumatic. No laceration.     Right Ear: Hearing normal.     Left Ear: Hearing normal.     Mouth/Throat:     Pharynx: Uvula midline.  Eyes:     Pupils: Pupils are equal, round, and reactive to light.  Neck:     Thyroid: No thyromegaly.  Cardiovascular:     Rate and Rhythm: Normal rate and regular rhythm.     Heart sounds: No murmur heard.   No friction rub. No gallop.  Pulmonary:     Effort: Pulmonary effort is normal. No respiratory distress.     Breath sounds: Normal breath sounds. No  wheezing.  Abdominal:     General: Bowel sounds are normal. There is no distension.     Palpations: Abdomen is soft.     Tenderness: There is no abdominal tenderness. There is no rebound.  Musculoskeletal:        General: Normal range of motion.     Cervical back: Normal range of motion and neck supple.  Neurological:     Mental Status: She is alert and oriented to person, place, and time.     Cranial Nerves: No cranial nerve deficit.  Skin:    General: Skin is warm and dry.  Psychiatric:        Judgment: Judgment normal.  Vitals reviewed.    Assessment:  ANNUAL EXAM 1. Women's annual routine gynecological examination   2. Screening for cervical cancer   3. Encounter for surveillance of transdermal patch hormonal contraceptive device      Screening Plan:            1.  Cervical Screening-  Pap smear done today  2. Breast screening- Exam annually and mammogram>40 planned   3. Colonoscopy every 10 years, Hemoccult testing - after age 43  4. Labs managed by PCP  5. Counseling for contraception: Kinnie Feil - 11/06/20 6812       Pregnancy Intention Screening   Does the patient want to become pregnant in the next year? No    Does the patient's partner want to become pregnant in the next year? No    Would the patient like to discuss contraceptive options today? No      Contraception Wrap Up   Current Method Contraceptive Patch    Contraception Counseling Provided No            The pregnancy intention screening data noted above was reviewed. Potential methods of contraception were discussed. The patient elected to proceed with Contraceptive Patch.   She uses patch 5 weeks on, one week off    F/U  Return in about 1 year (around 11/06/2021) for Annual.  Annamarie Major, MD, [facility], [facility] 11/06/2020  9:38 AM

## 2020-11-06 NOTE — Patient Instructions (Signed)
Recommendations to boost your immunity to prevent illness such as viral flu and colds, including covid19, are as follows:       - - -  Vitamin K2 and Vitamin D3  - - - Take Vitamin K2 at 200-300 mcg daily (usually 2-3 pills daily of the over the counter formulation). Take Vitamin D3 at 3000-4000 U daily (usually 3-4 pills daily of the over the counter formulation). Studies show that these two at high normal levels in your system are very effective in keeping your immunity so strong and protective that you will be unlikely to contract viral illness such as those listed above.  Also, Calcium for bone health and orevention of osteoporosis  Dr Tiburcio Pea  Thank you for choosing [facility]. As part of our ongoing efforts to improve patient experience, we would appreciate your feedback. Please fill out the short survey that you will receive by mail or MyChart. Your opinion is important to [place]! - Dr. Tiburcio Pea

## 2020-11-07 LAB — CYTOLOGY - PAP
Comment: NEGATIVE
Diagnosis: NEGATIVE
High risk HPV: NEGATIVE
